# Patient Record
Sex: Female | Born: 1940
Health system: Southern US, Community
[De-identification: ages and names within clinical notes are randomized; demographics above are authoritative.]

## PROBLEM LIST (undated history)

## (undated) DIAGNOSIS — K219 Gastro-esophageal reflux disease without esophagitis: Secondary | ICD-10-CM

## (undated) HISTORY — PX: CHOLECYSTECTOMY: SHX55

## (undated) HISTORY — PX: OTHER SURGICAL HISTORY: SHX169

## (undated) HISTORY — DX: Gastro-esophageal reflux disease without esophagitis: K21.9

---

## 2011-07-19 ENCOUNTER — Ambulatory Visit (INDEPENDENT_AMBULATORY_CARE_PROVIDER_SITE_OTHER): Payer: Medicare HMO | Admitting: Critical Care Medicine

## 2011-07-19 ENCOUNTER — Encounter: Payer: Self-pay | Admitting: Critical Care Medicine

## 2011-07-19 VITALS — BP 140/86 | HR 78 | Temp 97.4°F | Ht 62.5 in | Wt 126.0 lb

## 2011-07-19 DIAGNOSIS — R911 Solitary pulmonary nodule: Secondary | ICD-10-CM

## 2011-07-19 DIAGNOSIS — K219 Gastro-esophageal reflux disease without esophagitis: Secondary | ICD-10-CM

## 2011-07-19 MED ORDER — AZITHROMYCIN 250 MG PO TABS: 250.0000 mg | ORAL_TABLET | Freq: Every day | ORAL | Status: AC

## 2011-07-19 NOTE — Progress Notes (Signed)
Subjective:    Patient ID: Peggy Moore, female    DOB: 12-Oct-1940, 71 y.o.   MRN: [id number]  HPI 71 y.o. WF Referred for lung nodule. Never smoker.  First husband passive smoker 4yrs.  1986 passed away. 2011/03/16:  Awoke in am with chest pain. W/U: in [place].  Neg w/u for cardiac.   ?spot on lung .  PCP saw pt one week ago for a CT scan. Pt rx with URI at this point with cough prod green and is better.  Referral then made with abn CT. Cough now is better. Worse at night.  No chest pain.  Occ chest tightness.  Not dyspneic with exertion. Weight is same.  No new joint pain.  Has varicose veins.    Past Medical History  Diagnosis Date  . GERD (gastroesophageal reflux disease)      Family History  Problem Relation Age of Onset  . Stroke Mother   . Heart attack Father   . Colon cancer Brother      History   Social History  . Marital Status: Widowed    Spouse Name: N/A    Number of Children: 1  . Years of Education: N/A   Occupational History  . Retired     Environmental manager   Social History Main Topics  . Smoking status: Never Smoker   . Smokeless tobacco: Never Used   Comment: Passive smoke exposure x 29 yrs.  . Alcohol Use: No  . Drug Use: No  . Sexually Active: Not on file   Other Topics Concern  . Not on file   Social History Narrative  . No narrative on file     Allergies  Allergen Reactions  . Codeine     hallucinations     No outpatient prescriptions prior to visit.      Review of Systems  Constitutional: Negative for fever, chills, diaphoresis, activity change, appetite change, fatigue and unexpected weight change.  HENT: Positive for congestion and rhinorrhea. Negative for hearing loss, ear pain, nosebleeds, sore throat, facial swelling, sneezing, mouth sores, trouble swallowing, neck pain, neck stiffness, dental problem, voice change, postnasal drip, sinus pressure, tinnitus and ear discharge.   Eyes: Negative for photophobia, discharge,  itching and visual disturbance.  Respiratory: Positive for cough. Negative for apnea, choking, chest tightness, shortness of breath, wheezing and stridor.   Cardiovascular: Positive for chest pain. Negative for palpitations and leg swelling.  Gastrointestinal: Positive for vomiting. Negative for nausea, abdominal pain, constipation, blood in stool and abdominal distention.  Genitourinary: Positive for flank pain. Negative for dysuria, urgency, frequency, hematuria, decreased urine volume and difficulty urinating.  Musculoskeletal: Negative for myalgias, back pain, joint swelling, arthralgias and gait problem.  Skin: Negative for color change, pallor and rash.  Neurological: Negative for dizziness, tremors, seizures, syncope, speech difficulty, weakness, light-headedness, numbness and headaches.  Hematological: Negative for adenopathy. Bruises/bleeds easily.  Psychiatric/Behavioral: Positive for sleep disturbance. Negative for confusion and agitation. The patient is not nervous/anxious.        Objective:   Physical Exam Filed Vitals:   07/19/11 0953  BP: 140/86  Pulse: 78  Temp: 97.4 F (36.3 C)  TempSrc: Oral  Height: 5' 2.5" (1.588 m)  Weight: 126 lb (57.153 kg)  SpO2: 100%    Gen: Pleasant, well-nourished, in no distress,  normal affect  ENT: No lesions,  mouth clear,  oropharynx clear, no postnasal drip  Neck: No JVD, no TMG, no carotid bruits  Lungs: No use of accessory muscles, no  dullness to percussion, clear without rales or rhonchi  Cardiovascular: RRR, heart sounds normal, no murmur or gallops, no peripheral edema  Abdomen: soft and NT, no HSM,  BS normal  Musculoskeletal: No deformities, no cyanosis or clubbing  Neuro: alert, non focal  Skin: Warm, no lesions or rashes  CT Chest 07/12/11:  6 x 7 mm nodule spiculated at end of dilated airway No LAN no other nodules seen         Assessment & Plan:   Lung nodule RLL lung nodule appears to be either old  granuloma or scar. Doubt CA Plan Repeat CT Chest in 6months No indication for biopsy   Acute bronchitis Plan 5 days of azithromycin Updated Medication List Outpatient Encounter Prescriptions as of 07/19/2011  Medication Sig Dispense Refill  . Multiple Vitamin (MULTIVITAMIN) tablet Take 1 tablet by mouth daily.      . pantoprazole (PROTONIX) 40 MG tablet Take 40 mg by mouth daily.       . vitamin B-12 (CYANOCOBALAMIN) 500 MCG tablet Take 500 mcg by mouth daily.      Marland Kitchen azithromycin (ZITHROMAX) 250 MG tablet Take 1 tablet (250 mg total) by mouth daily. Take two once then one daily until gone  6 each  0

## 2011-07-19 NOTE — Patient Instructions (Addendum)
Take azithromycin 250mg  Take two once then one daily until gone Call if cough unimproving Repeat CT Chest in 6months at [facility], I will call with results

## 2011-07-19 NOTE — Assessment & Plan Note (Signed)
RLL lung nodule appears to be either old granuloma or scar. Doubt CA Plan Repeat CT Chest in 6months No indication for biopsy

## 2012-01-22 ENCOUNTER — Telehealth: Payer: Self-pay | Admitting: Critical Care Medicine

## 2012-01-22 DIAGNOSIS — R911 Solitary pulmonary nodule: Secondary | ICD-10-CM

## 2012-01-22 NOTE — Telephone Encounter (Signed)
Pt needs a CT Chest ordered soon.  Hx of lung nodule  I put order into epic

## 2012-01-22 NOTE — Telephone Encounter (Signed)
Chest ct@[facility]  02/15/12@11 :00am pt is aware Tobe Sos

## 2012-01-25 ENCOUNTER — Other Ambulatory Visit: Payer: Medicare HMO

## 2012-01-29 ENCOUNTER — Telehealth: Payer: Self-pay | Admitting: Critical Care Medicine

## 2012-02-04 NOTE — Telephone Encounter (Signed)
error 

## 2012-02-15 ENCOUNTER — Other Ambulatory Visit: Payer: Medicare HMO

## 2012-02-18 ENCOUNTER — Ambulatory Visit (HOSPITAL_COMMUNITY): Payer: Medicare HMO

## 2012-02-19 ENCOUNTER — Ambulatory Visit (HOSPITAL_COMMUNITY): Payer: Medicare HMO

## 2012-02-21 ENCOUNTER — Ambulatory Visit (HOSPITAL_COMMUNITY): Payer: Medicare HMO

## 2012-02-25 ENCOUNTER — Inpatient Hospital Stay (HOSPITAL_COMMUNITY): Admission: RE | Admit: 2012-02-25 | Payer: Medicare HMO | Source: Ambulatory Visit

## 2012-02-25 ENCOUNTER — Ambulatory Visit (HOSPITAL_COMMUNITY): Payer: Medicare HMO

## 2012-02-26 ENCOUNTER — Ambulatory Visit (HOSPITAL_COMMUNITY): Payer: Medicare HMO

## 2012-02-28 ENCOUNTER — Ambulatory Visit (HOSPITAL_COMMUNITY): Payer: Medicare HMO

## 2012-03-03 ENCOUNTER — Ambulatory Visit (HOSPITAL_COMMUNITY): Payer: Medicare HMO

## 2012-03-04 ENCOUNTER — Ambulatory Visit (HOSPITAL_COMMUNITY): Payer: Medicare HMO

## 2012-03-06 ENCOUNTER — Ambulatory Visit (HOSPITAL_COMMUNITY): Payer: Medicare HMO

## 2012-03-10 ENCOUNTER — Ambulatory Visit (HOSPITAL_COMMUNITY): Payer: Medicare HMO

## 2012-03-11 ENCOUNTER — Ambulatory Visit (HOSPITAL_COMMUNITY): Payer: Medicare HMO

## 2012-03-13 ENCOUNTER — Ambulatory Visit (HOSPITAL_COMMUNITY): Payer: Medicare HMO

## 2012-03-17 ENCOUNTER — Ambulatory Visit (HOSPITAL_COMMUNITY): Payer: Medicare HMO

## 2012-03-18 ENCOUNTER — Ambulatory Visit (HOSPITAL_COMMUNITY): Payer: Medicare HMO

## 2012-03-20 ENCOUNTER — Ambulatory Visit (HOSPITAL_COMMUNITY): Payer: Medicare HMO

## 2012-03-24 ENCOUNTER — Ambulatory Visit (HOSPITAL_COMMUNITY): Payer: Medicare HMO

## 2012-03-25 ENCOUNTER — Ambulatory Visit (HOSPITAL_COMMUNITY): Payer: Medicare HMO

## 2012-03-27 ENCOUNTER — Ambulatory Visit (HOSPITAL_COMMUNITY): Payer: Medicare HMO

## 2012-03-31 ENCOUNTER — Ambulatory Visit (HOSPITAL_COMMUNITY): Payer: Medicare HMO

## 2012-04-01 ENCOUNTER — Ambulatory Visit (HOSPITAL_COMMUNITY): Payer: Medicare HMO

## 2012-04-02 ENCOUNTER — Telehealth: Payer: Self-pay | Admitting: Critical Care Medicine

## 2012-04-02 DIAGNOSIS — E854 Organ-limited amyloidosis: Secondary | ICD-10-CM

## 2012-04-02 DIAGNOSIS — R911 Solitary pulmonary nodule: Secondary | ICD-10-CM

## 2012-04-02 NOTE — Telephone Encounter (Signed)
CT Chest order sent to [facility] with instructions to set up ov with PW for f/u after ct done [facility] for pt

## 2012-04-02 NOTE — Telephone Encounter (Signed)
Pt last seen 07-19-11 for lung nodule and was advised to have repeat CT scan in 6 months to f/u on this. Pt cancelled CT appt in September and is now calling  to have this rescheduled. Please advise if ok to re-order the CT and does the pt need a f/u here after CT scan? Please advise. Carron Curie, CMA

## 2012-04-02 NOTE — Telephone Encounter (Signed)
Re order CT Chest non contrast .  Needs OV after CT done

## 2012-04-03 ENCOUNTER — Ambulatory Visit (HOSPITAL_COMMUNITY): Payer: Medicare HMO

## 2012-04-03 NOTE — Telephone Encounter (Signed)
LMOMTCB x 1 for the pt.

## 2012-04-04 NOTE — Telephone Encounter (Signed)
Patient returned call. Patient has been scheduled for CT scan Mon 04/07/12 at 1030am.  Follow Up Appt w Dr. Delford Field has also been scheduled for 04/09/12 at 1045am.  Verified with patient and nothing further needed at this time.

## 2012-04-07 ENCOUNTER — Ambulatory Visit (INDEPENDENT_AMBULATORY_CARE_PROVIDER_SITE_OTHER)
Admission: RE | Admit: 2012-04-07 | Discharge: 2012-04-07 | Disposition: A | Discharge status: Home / Self Care | Payer: Medicare HMO | Source: Ambulatory Visit | Attending: Critical Care Medicine | Admitting: Critical Care Medicine

## 2012-04-07 ENCOUNTER — Ambulatory Visit (HOSPITAL_COMMUNITY): Payer: Medicare HMO

## 2012-04-07 DIAGNOSIS — R911 Solitary pulmonary nodule: Secondary | ICD-10-CM

## 2012-04-08 ENCOUNTER — Ambulatory Visit (HOSPITAL_COMMUNITY): Payer: Medicare HMO

## 2012-04-08 ENCOUNTER — Telehealth: Payer: Self-pay | Admitting: Critical Care Medicine

## 2012-04-08 DIAGNOSIS — R911 Solitary pulmonary nodule: Secondary | ICD-10-CM

## 2012-04-08 NOTE — Telephone Encounter (Signed)
No change in nodule in lung. Repeat CT spring 2015

## 2012-04-09 ENCOUNTER — Ambulatory Visit: Payer: Medicare HMO | Admitting: Critical Care Medicine

## 2012-04-10 ENCOUNTER — Ambulatory Visit (HOSPITAL_COMMUNITY): Payer: Medicare HMO

## 2012-04-14 ENCOUNTER — Ambulatory Visit (HOSPITAL_COMMUNITY): Payer: Medicare HMO

## 2012-04-15 ENCOUNTER — Ambulatory Visit (HOSPITAL_COMMUNITY): Payer: Medicare HMO

## 2012-04-17 ENCOUNTER — Ambulatory Visit (HOSPITAL_COMMUNITY): Payer: Medicare HMO

## 2012-04-21 ENCOUNTER — Ambulatory Visit (HOSPITAL_COMMUNITY): Payer: Medicare HMO

## 2012-04-22 ENCOUNTER — Ambulatory Visit (HOSPITAL_COMMUNITY): Payer: Medicare HMO

## 2012-04-24 ENCOUNTER — Ambulatory Visit (HOSPITAL_COMMUNITY): Payer: Medicare HMO

## 2012-04-28 ENCOUNTER — Ambulatory Visit (HOSPITAL_COMMUNITY): Payer: Medicare HMO

## 2012-07-21 ENCOUNTER — Telehealth: Payer: Self-pay | Admitting: Pulmonary Disease

## 2012-07-21 ENCOUNTER — Encounter: Payer: Self-pay | Admitting: Pulmonary Disease

## 2012-07-21 NOTE — Telephone Encounter (Signed)
Peggy Bible  Was called evening of 3/24 by this patient via answering service. Call back number given as [phone]. My return call was not answered. Not sure what the reason for the call was  Billy Fischer, MD ; [facility] [phone].  After 5:30 PM or weekends, call [phone]

## 2012-07-21 NOTE — Telephone Encounter (Signed)
pls call pt in am and find out what she needs

## 2012-07-22 NOTE — Telephone Encounter (Signed)
Noted and thanks.

## 2012-07-22 NOTE — Telephone Encounter (Signed)
Called spoke with patient who stated that she did not call yesterday - she has been out of the country on vacation and reports no issues.  Called the phone number documented below > line rang multiple times with no answer and no option to leave message.  Unable to search for patient by phone number in epic.    There is another message in triage for a patient w/ similarly-spelled name and the phone number is the same with juxtaposed numbers.  Will sign and forward to PW as FYI.

## 2013-09-10 ENCOUNTER — Telehealth: Payer: Self-pay | Admitting: *Deleted

## 2013-09-10 DIAGNOSIS — R911 Solitary pulmonary nodule: Secondary | ICD-10-CM

## 2013-09-10 NOTE — Telephone Encounter (Signed)
Message copied by Valentino HueJONES, Chianne Byrns D on Thu Sep 10, 2013  1:57 PM ------      Message from: Shan LevansWRIGHT, PATRICK E      Created: Thu Sep 10, 2013 11:32 AM       Needs repeat ct scan to f/u lung nodule  No contrast      ----- Message -----         From: Storm FriskPatrick E Wright, MD         Sent: 04/08/2012   3:29 PM           To: Storm FriskPatrick E Wright, MD            Repeat ct chest        ------

## 2013-09-10 NOTE — Telephone Encounter (Signed)
CT order placed. lmomtcb for pt to inform her PCCs will be calling to schedule this.

## 2013-09-14 NOTE — Telephone Encounter (Signed)
Called, spoke with pt.  She has already been called -- CT is scheduled for May 21. Pt aware and voiced no further questions or concerns at this time.

## 2013-09-17 ENCOUNTER — Ambulatory Visit (INDEPENDENT_AMBULATORY_CARE_PROVIDER_SITE_OTHER)
Admission: RE | Admit: 2013-09-17 | Discharge: 2013-09-17 | Disposition: A | Discharge status: Home / Self Care | Payer: Commercial Managed Care - HMO | Source: Ambulatory Visit | Attending: Critical Care Medicine | Admitting: Critical Care Medicine

## 2013-09-17 DIAGNOSIS — R911 Solitary pulmonary nodule: Secondary | ICD-10-CM

## 2013-09-24 ENCOUNTER — Telehealth: Payer: Self-pay | Admitting: Critical Care Medicine

## 2013-09-24 DIAGNOSIS — R911 Solitary pulmonary nodule: Secondary | ICD-10-CM

## 2013-09-24 NOTE — Telephone Encounter (Signed)
lmtcb x1 

## 2013-09-24 NOTE — Telephone Encounter (Signed)
CT Scan of chest shows NO change in nodule and is BENIGN.  No further CT scans needed

## 2013-09-28 NOTE — Telephone Encounter (Signed)
lmomtcb  

## 2013-09-29 NOTE — Telephone Encounter (Signed)
lmomtcb for pt 

## 2013-09-29 NOTE — Telephone Encounter (Signed)
lmomtcb for pt on # provided below by pt

## 2013-09-29 NOTE — Telephone Encounter (Signed)
Pt returned call & asks to be reached at [phone].  Peggy Moore

## 2013-09-30 NOTE — Telephone Encounter (Signed)
Called, spoke with pt.  Informed her of CT Chest results and recs per Dr.  Wright.  She verbalized understanding and voiced no further questions or concerns at this time. 

## 2013-12-20 IMAGING — CT CT CHEST W/O CM
2 of 3 series · 15 of 36 positions shown, 18 images · IV contrast (Omnipaque 300)
Comparison: 07/12/2011

CLINICAL DATA: Follow up pulmonary nodule

CT CHEST WITHOUT CONTRAST
TECHNIQUE: Multidetector CT imaging of the chest was performed
following the standard protocol without IV contrast.

[Series 2: chest routine with · axial · 0.62mm/px · z∈[-286,-40]mm · 12 of 59 slices shown, 15 images]
[im 5/59  mediastinal]
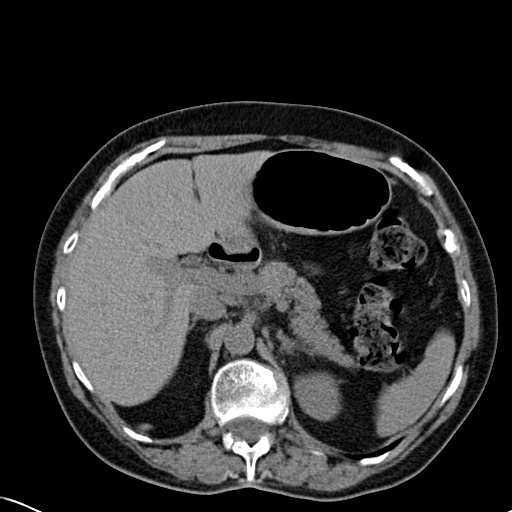
[im 5/59  lung]
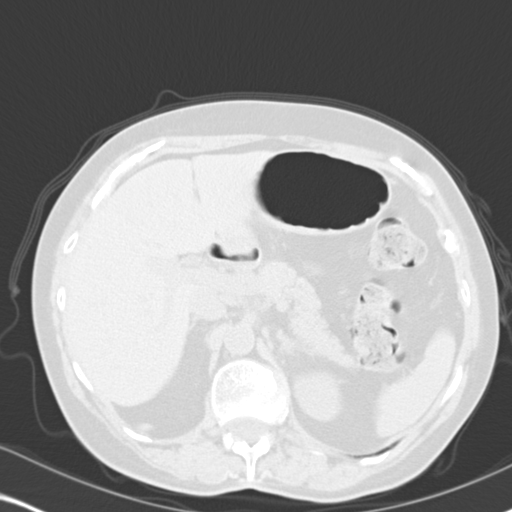
[im 9/59  lung]
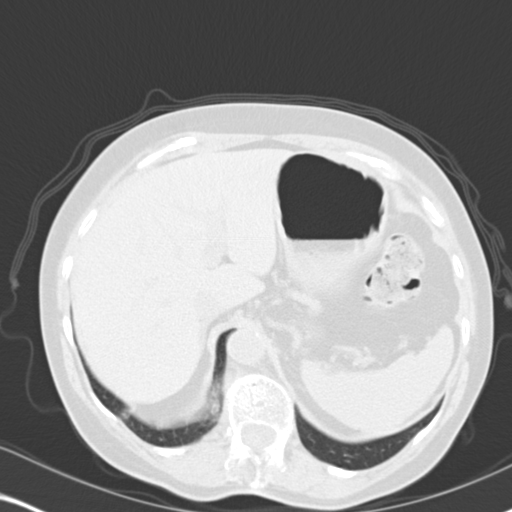
[im 13/59  lung]
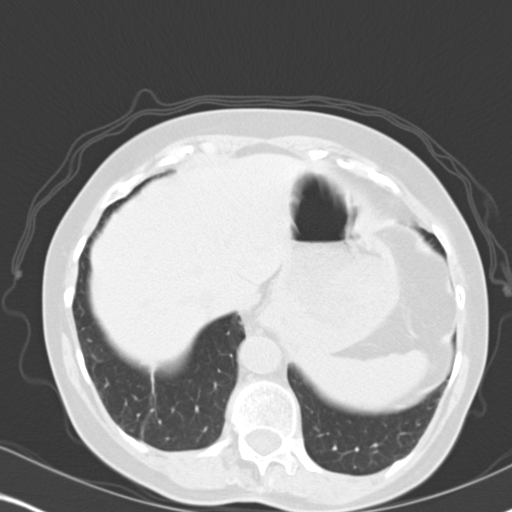
[im 18/59  lung]
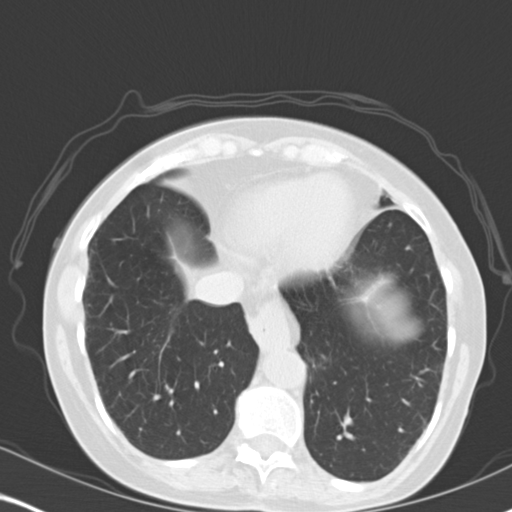
[im 22/59  mediastinal]
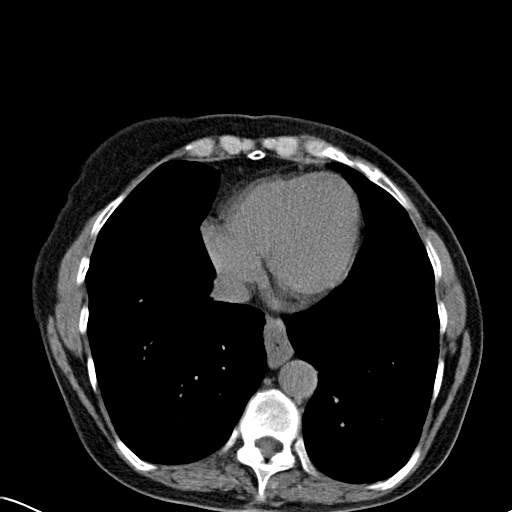
[im 22/59  lung]
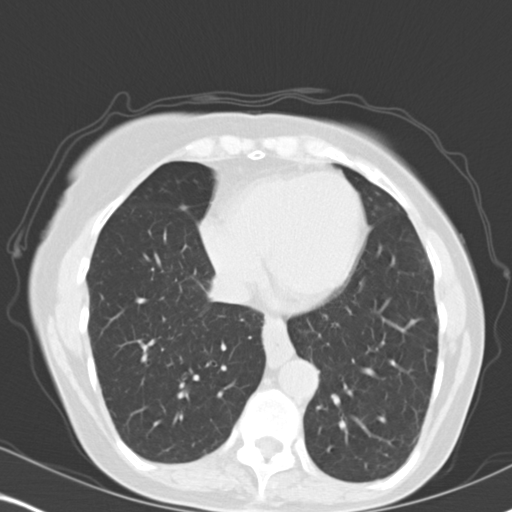
[im 26/59  lung]
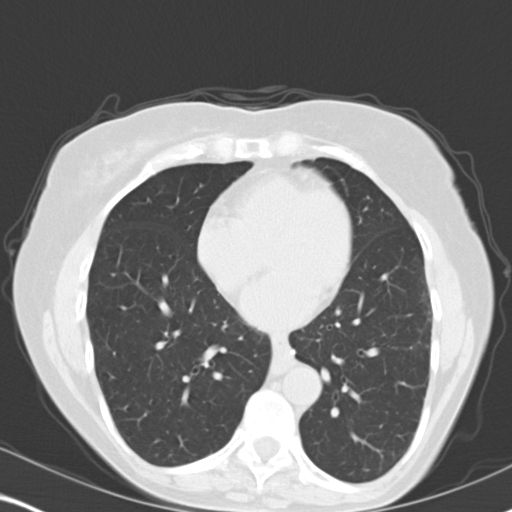
[im 33/59  lung]
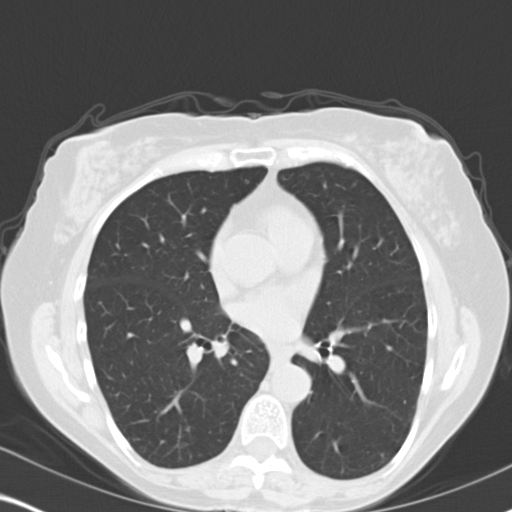
[im 37/59  lung]
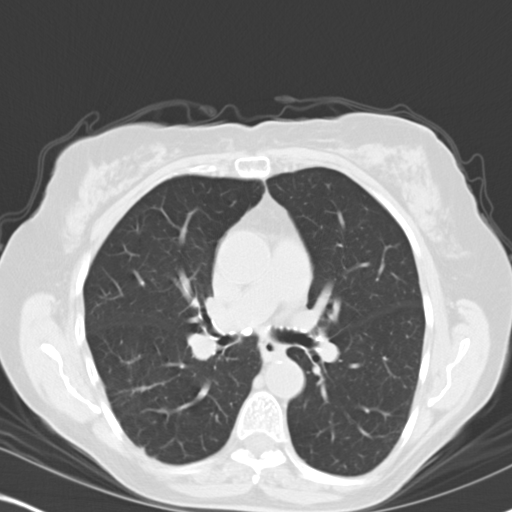
[im 41/59  mediastinal]
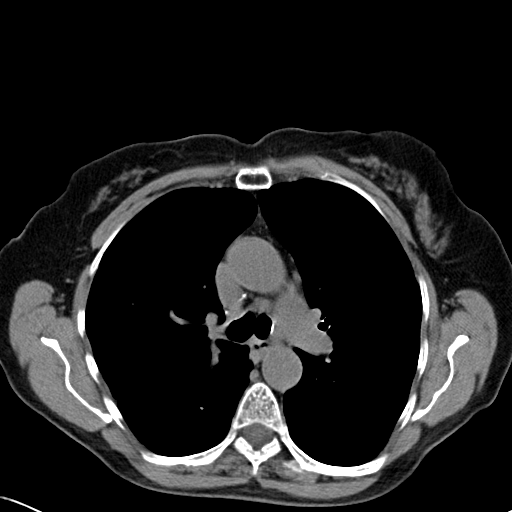
[im 41/59  lung]
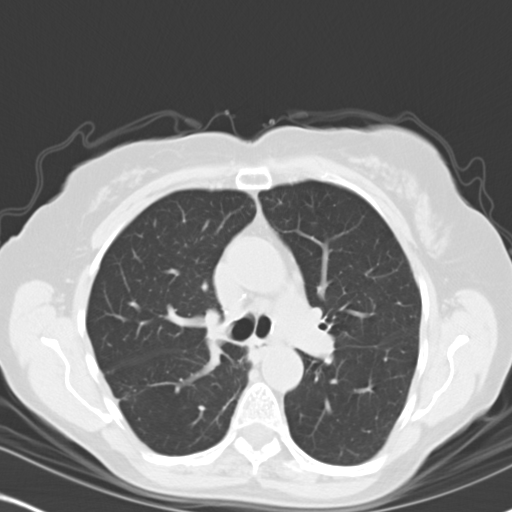
[im 46/59  lung]
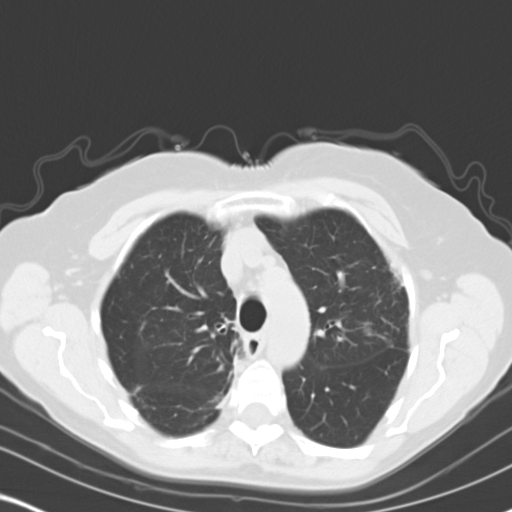
[im 50/59  lung]
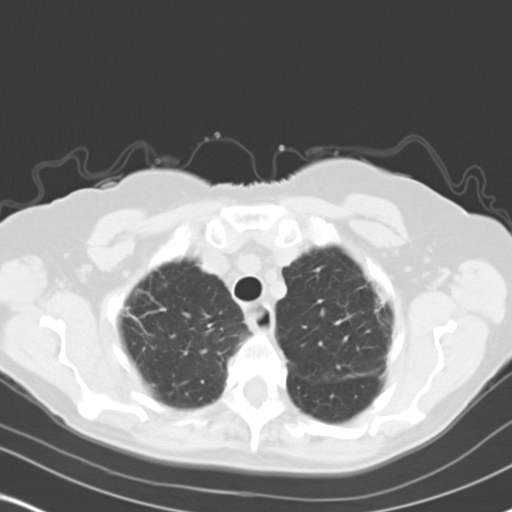
[im 54/59  lung]
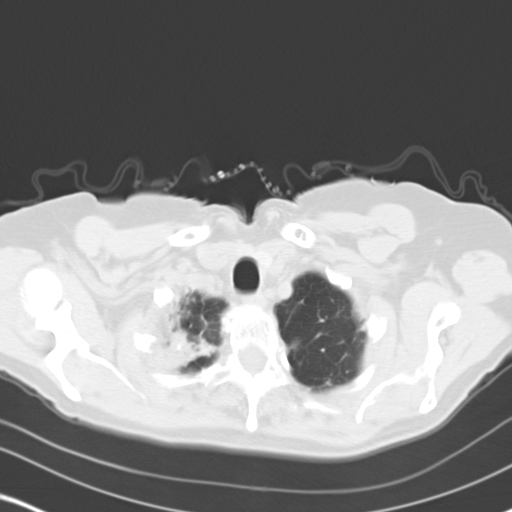

[Series 602: cor · coronal · 0.62mm/px · 3 of 111 slices shown]
[im 23/111  lung]
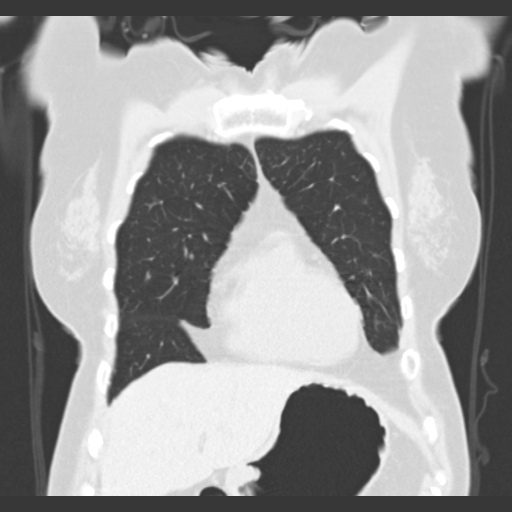
[im 45/111  lung]
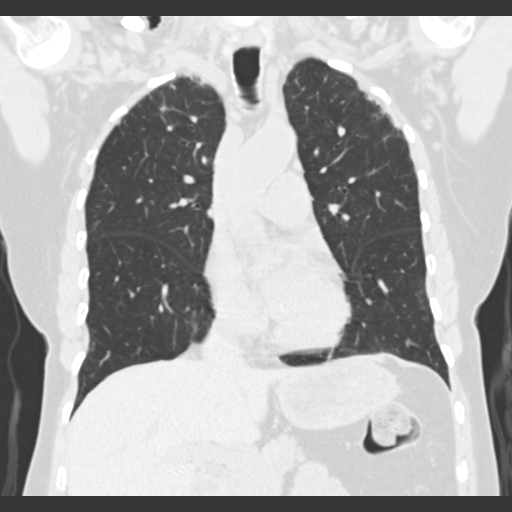
[im 67/111  lung]
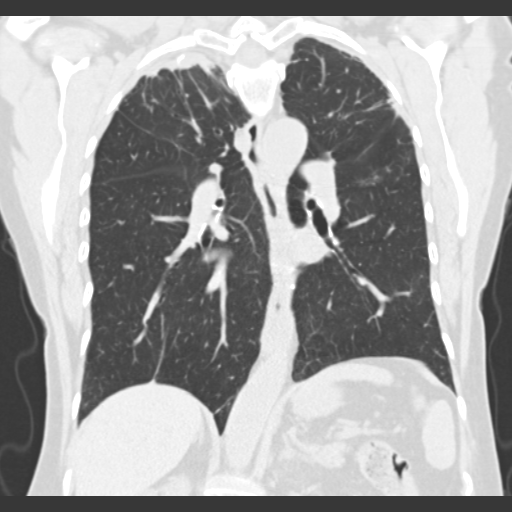

[15 of 36 positions shown; findings below may reference images not displayed]

FINDINGS: Lungs/pleura: There is no pleural effusion.
Bronchiectasis within the superior segment of the right lower lobe
is identified.  There is a pulmonary nodule within the superior
segment of the right lower lobe which measures 6 mm, image 21.
This is unchanged from previous exam.  Upper lobe predominant
peripheral and apical scarring is identified and appears similar to
previous exam.

Heart/Mediastinum: Normal heart size.  Calcified mediastinal lymph
nodes are noted.  No adenopathy.  No pericardial effusion.

Upper abdomen: Previous cholecystectomy. Small hiatal hernia noted.
There is circumferential wall thickening involving the distal
esophagus.

Bones/Musculoskeletal:  The bones appear osteopenic.
IMPRESSION: 1.  Stable spiculated nodule within the superior segment of the
right lower lobe. The next follow-up exam should be obtained in 18-
24 months.

2.  Focal area of bronchiectasis within the superior segment of the
right lower lobe as before.
3.  Small hiatal hernia with distal esophageal wall thickening.

4.  Prior cholecystectomy.

## 2014-05-03 DIAGNOSIS — E780 Pure hypercholesterolemia: Secondary | ICD-10-CM | POA: Diagnosis not present

## 2014-05-03 DIAGNOSIS — G4700 Insomnia, unspecified: Secondary | ICD-10-CM | POA: Diagnosis not present

## 2014-05-03 DIAGNOSIS — Z9181 History of falling: Secondary | ICD-10-CM | POA: Diagnosis not present

## 2014-05-03 DIAGNOSIS — R911 Solitary pulmonary nodule: Secondary | ICD-10-CM | POA: Diagnosis not present

## 2014-05-03 DIAGNOSIS — Z1389 Encounter for screening for other disorder: Secondary | ICD-10-CM | POA: Diagnosis not present

## 2014-05-03 DIAGNOSIS — K219 Gastro-esophageal reflux disease without esophagitis: Secondary | ICD-10-CM | POA: Diagnosis not present

## 2014-05-03 DIAGNOSIS — J309 Allergic rhinitis, unspecified: Secondary | ICD-10-CM | POA: Diagnosis not present

## 2014-05-03 DIAGNOSIS — E46 Unspecified protein-calorie malnutrition: Secondary | ICD-10-CM | POA: Diagnosis not present

## 2014-06-25 DIAGNOSIS — G4700 Insomnia, unspecified: Secondary | ICD-10-CM | POA: Diagnosis not present

## 2014-06-25 DIAGNOSIS — R911 Solitary pulmonary nodule: Secondary | ICD-10-CM | POA: Diagnosis not present

## 2014-06-25 DIAGNOSIS — J309 Allergic rhinitis, unspecified: Secondary | ICD-10-CM | POA: Diagnosis not present

## 2014-06-25 DIAGNOSIS — E780 Pure hypercholesterolemia: Secondary | ICD-10-CM | POA: Diagnosis not present

## 2014-06-25 DIAGNOSIS — E46 Unspecified protein-calorie malnutrition: Secondary | ICD-10-CM | POA: Diagnosis not present

## 2014-06-25 DIAGNOSIS — K219 Gastro-esophageal reflux disease without esophagitis: Secondary | ICD-10-CM | POA: Diagnosis not present

## 2014-06-25 DIAGNOSIS — B029 Zoster without complications: Secondary | ICD-10-CM | POA: Diagnosis not present

## 2014-06-25 DIAGNOSIS — M549 Dorsalgia, unspecified: Secondary | ICD-10-CM | POA: Diagnosis not present

## 2014-07-01 DIAGNOSIS — B029 Zoster without complications: Secondary | ICD-10-CM | POA: Diagnosis not present

## 2014-07-01 DIAGNOSIS — E780 Pure hypercholesterolemia: Secondary | ICD-10-CM | POA: Diagnosis not present

## 2014-07-01 DIAGNOSIS — M549 Dorsalgia, unspecified: Secondary | ICD-10-CM | POA: Diagnosis not present

## 2014-07-01 DIAGNOSIS — K219 Gastro-esophageal reflux disease without esophagitis: Secondary | ICD-10-CM | POA: Diagnosis not present

## 2014-07-01 DIAGNOSIS — J309 Allergic rhinitis, unspecified: Secondary | ICD-10-CM | POA: Diagnosis not present

## 2014-07-01 DIAGNOSIS — G4700 Insomnia, unspecified: Secondary | ICD-10-CM | POA: Diagnosis not present

## 2014-07-01 DIAGNOSIS — E46 Unspecified protein-calorie malnutrition: Secondary | ICD-10-CM | POA: Diagnosis not present

## 2014-07-01 DIAGNOSIS — R911 Solitary pulmonary nodule: Secondary | ICD-10-CM | POA: Diagnosis not present

## 2014-08-02 DIAGNOSIS — E46 Unspecified protein-calorie malnutrition: Secondary | ICD-10-CM | POA: Diagnosis not present

## 2014-08-02 DIAGNOSIS — E780 Pure hypercholesterolemia: Secondary | ICD-10-CM | POA: Diagnosis not present

## 2014-08-02 DIAGNOSIS — R911 Solitary pulmonary nodule: Secondary | ICD-10-CM | POA: Diagnosis not present

## 2014-08-02 DIAGNOSIS — J309 Allergic rhinitis, unspecified: Secondary | ICD-10-CM | POA: Diagnosis not present

## 2014-08-02 DIAGNOSIS — G4700 Insomnia, unspecified: Secondary | ICD-10-CM | POA: Diagnosis not present

## 2014-08-02 DIAGNOSIS — K219 Gastro-esophageal reflux disease without esophagitis: Secondary | ICD-10-CM | POA: Diagnosis not present

## 2014-08-02 DIAGNOSIS — M549 Dorsalgia, unspecified: Secondary | ICD-10-CM | POA: Diagnosis not present

## 2014-08-02 DIAGNOSIS — Z681 Body mass index (BMI) 19 or less, adult: Secondary | ICD-10-CM | POA: Diagnosis not present

## 2014-09-10 DIAGNOSIS — M549 Dorsalgia, unspecified: Secondary | ICD-10-CM | POA: Diagnosis not present

## 2014-09-10 DIAGNOSIS — R911 Solitary pulmonary nodule: Secondary | ICD-10-CM | POA: Diagnosis not present

## 2014-09-10 DIAGNOSIS — G4700 Insomnia, unspecified: Secondary | ICD-10-CM | POA: Diagnosis not present

## 2014-09-10 DIAGNOSIS — L309 Dermatitis, unspecified: Secondary | ICD-10-CM | POA: Diagnosis not present

## 2014-09-10 DIAGNOSIS — E46 Unspecified protein-calorie malnutrition: Secondary | ICD-10-CM | POA: Diagnosis not present

## 2014-09-10 DIAGNOSIS — J309 Allergic rhinitis, unspecified: Secondary | ICD-10-CM | POA: Diagnosis not present

## 2014-09-10 DIAGNOSIS — E780 Pure hypercholesterolemia: Secondary | ICD-10-CM | POA: Diagnosis not present

## 2014-09-10 DIAGNOSIS — K219 Gastro-esophageal reflux disease without esophagitis: Secondary | ICD-10-CM | POA: Diagnosis not present

## 2014-09-29 DIAGNOSIS — N390 Urinary tract infection, site not specified: Secondary | ICD-10-CM | POA: Diagnosis not present

## 2014-09-29 DIAGNOSIS — K219 Gastro-esophageal reflux disease without esophagitis: Secondary | ICD-10-CM | POA: Diagnosis not present

## 2014-09-29 DIAGNOSIS — M549 Dorsalgia, unspecified: Secondary | ICD-10-CM | POA: Diagnosis not present

## 2014-09-29 DIAGNOSIS — G4700 Insomnia, unspecified: Secondary | ICD-10-CM | POA: Diagnosis not present

## 2014-09-29 DIAGNOSIS — E46 Unspecified protein-calorie malnutrition: Secondary | ICD-10-CM | POA: Diagnosis not present

## 2014-09-29 DIAGNOSIS — R911 Solitary pulmonary nodule: Secondary | ICD-10-CM | POA: Diagnosis not present

## 2014-09-29 DIAGNOSIS — J309 Allergic rhinitis, unspecified: Secondary | ICD-10-CM | POA: Diagnosis not present

## 2014-09-29 DIAGNOSIS — E780 Pure hypercholesterolemia: Secondary | ICD-10-CM | POA: Diagnosis not present

## 2014-11-03 DIAGNOSIS — M549 Dorsalgia, unspecified: Secondary | ICD-10-CM | POA: Diagnosis not present

## 2014-11-03 DIAGNOSIS — E780 Pure hypercholesterolemia: Secondary | ICD-10-CM | POA: Diagnosis not present

## 2014-11-03 DIAGNOSIS — K219 Gastro-esophageal reflux disease without esophagitis: Secondary | ICD-10-CM | POA: Diagnosis not present

## 2014-11-03 DIAGNOSIS — N390 Urinary tract infection, site not specified: Secondary | ICD-10-CM | POA: Diagnosis not present

## 2014-11-03 DIAGNOSIS — R911 Solitary pulmonary nodule: Secondary | ICD-10-CM | POA: Diagnosis not present

## 2014-11-03 DIAGNOSIS — E46 Unspecified protein-calorie malnutrition: Secondary | ICD-10-CM | POA: Diagnosis not present

## 2014-11-03 DIAGNOSIS — G4700 Insomnia, unspecified: Secondary | ICD-10-CM | POA: Diagnosis not present

## 2014-11-03 DIAGNOSIS — J309 Allergic rhinitis, unspecified: Secondary | ICD-10-CM | POA: Diagnosis not present

## 2014-11-04 DIAGNOSIS — K219 Gastro-esophageal reflux disease without esophagitis: Secondary | ICD-10-CM | POA: Diagnosis not present

## 2014-11-04 DIAGNOSIS — M549 Dorsalgia, unspecified: Secondary | ICD-10-CM | POA: Diagnosis not present

## 2014-11-04 DIAGNOSIS — E46 Unspecified protein-calorie malnutrition: Secondary | ICD-10-CM | POA: Diagnosis not present

## 2014-11-04 DIAGNOSIS — E780 Pure hypercholesterolemia: Secondary | ICD-10-CM | POA: Diagnosis not present

## 2014-11-04 DIAGNOSIS — L309 Dermatitis, unspecified: Secondary | ICD-10-CM | POA: Diagnosis not present

## 2014-11-04 DIAGNOSIS — R911 Solitary pulmonary nodule: Secondary | ICD-10-CM | POA: Diagnosis not present

## 2014-11-04 DIAGNOSIS — J309 Allergic rhinitis, unspecified: Secondary | ICD-10-CM | POA: Diagnosis not present

## 2014-11-04 DIAGNOSIS — G4700 Insomnia, unspecified: Secondary | ICD-10-CM | POA: Diagnosis not present

## 2014-11-16 DIAGNOSIS — J309 Allergic rhinitis, unspecified: Secondary | ICD-10-CM | POA: Diagnosis not present

## 2014-11-16 DIAGNOSIS — E46 Unspecified protein-calorie malnutrition: Secondary | ICD-10-CM | POA: Diagnosis not present

## 2014-11-16 DIAGNOSIS — E780 Pure hypercholesterolemia: Secondary | ICD-10-CM | POA: Diagnosis not present

## 2014-11-16 DIAGNOSIS — R911 Solitary pulmonary nodule: Secondary | ICD-10-CM | POA: Diagnosis not present

## 2014-11-16 DIAGNOSIS — K219 Gastro-esophageal reflux disease without esophagitis: Secondary | ICD-10-CM | POA: Diagnosis not present

## 2014-11-16 DIAGNOSIS — M549 Dorsalgia, unspecified: Secondary | ICD-10-CM | POA: Diagnosis not present

## 2014-11-16 DIAGNOSIS — G4700 Insomnia, unspecified: Secondary | ICD-10-CM | POA: Diagnosis not present

## 2014-11-16 DIAGNOSIS — K120 Recurrent oral aphthae: Secondary | ICD-10-CM | POA: Diagnosis not present

## 2015-01-26 DIAGNOSIS — Z23 Encounter for immunization: Secondary | ICD-10-CM | POA: Diagnosis not present

## 2015-01-26 DIAGNOSIS — K219 Gastro-esophageal reflux disease without esophagitis: Secondary | ICD-10-CM | POA: Diagnosis not present

## 2015-01-26 DIAGNOSIS — E780 Pure hypercholesterolemia: Secondary | ICD-10-CM | POA: Diagnosis not present

## 2015-01-26 DIAGNOSIS — J309 Allergic rhinitis, unspecified: Secondary | ICD-10-CM | POA: Diagnosis not present

## 2015-01-26 DIAGNOSIS — R911 Solitary pulmonary nodule: Secondary | ICD-10-CM | POA: Diagnosis not present

## 2015-01-26 DIAGNOSIS — G4700 Insomnia, unspecified: Secondary | ICD-10-CM | POA: Diagnosis not present

## 2015-01-26 DIAGNOSIS — N3281 Overactive bladder: Secondary | ICD-10-CM | POA: Diagnosis not present

## 2015-01-26 DIAGNOSIS — M549 Dorsalgia, unspecified: Secondary | ICD-10-CM | POA: Diagnosis not present

## 2015-02-03 DIAGNOSIS — E7800 Pure hypercholesterolemia, unspecified: Secondary | ICD-10-CM | POA: Diagnosis not present

## 2015-02-03 DIAGNOSIS — G4700 Insomnia, unspecified: Secondary | ICD-10-CM | POA: Diagnosis not present

## 2015-02-03 DIAGNOSIS — K219 Gastro-esophageal reflux disease without esophagitis: Secondary | ICD-10-CM | POA: Diagnosis not present

## 2015-02-03 DIAGNOSIS — E46 Unspecified protein-calorie malnutrition: Secondary | ICD-10-CM | POA: Diagnosis not present

## 2015-02-03 DIAGNOSIS — M549 Dorsalgia, unspecified: Secondary | ICD-10-CM | POA: Diagnosis not present

## 2015-02-03 DIAGNOSIS — R911 Solitary pulmonary nodule: Secondary | ICD-10-CM | POA: Diagnosis not present

## 2015-02-03 DIAGNOSIS — J309 Allergic rhinitis, unspecified: Secondary | ICD-10-CM | POA: Diagnosis not present

## 2015-02-03 DIAGNOSIS — Z681 Body mass index (BMI) 19 or less, adult: Secondary | ICD-10-CM | POA: Diagnosis not present

## 2015-05-13 DIAGNOSIS — K219 Gastro-esophageal reflux disease without esophagitis: Secondary | ICD-10-CM | POA: Diagnosis not present

## 2015-05-13 DIAGNOSIS — G4700 Insomnia, unspecified: Secondary | ICD-10-CM | POA: Diagnosis not present

## 2015-05-13 DIAGNOSIS — J309 Allergic rhinitis, unspecified: Secondary | ICD-10-CM | POA: Diagnosis not present

## 2015-05-13 DIAGNOSIS — E7800 Pure hypercholesterolemia, unspecified: Secondary | ICD-10-CM | POA: Diagnosis not present

## 2015-05-13 DIAGNOSIS — Z1389 Encounter for screening for other disorder: Secondary | ICD-10-CM | POA: Diagnosis not present

## 2015-05-13 DIAGNOSIS — R911 Solitary pulmonary nodule: Secondary | ICD-10-CM | POA: Diagnosis not present

## 2015-05-13 DIAGNOSIS — E46 Unspecified protein-calorie malnutrition: Secondary | ICD-10-CM | POA: Diagnosis not present

## 2015-05-13 DIAGNOSIS — M549 Dorsalgia, unspecified: Secondary | ICD-10-CM | POA: Diagnosis not present

## 2015-05-13 DIAGNOSIS — J209 Acute bronchitis, unspecified: Secondary | ICD-10-CM | POA: Diagnosis not present

## 2015-06-28 DIAGNOSIS — H3343 Traction detachment of retina, bilateral: Secondary | ICD-10-CM | POA: Diagnosis not present

## 2015-06-28 DIAGNOSIS — Z961 Presence of intraocular lens: Secondary | ICD-10-CM | POA: Diagnosis not present

## 2015-07-11 DIAGNOSIS — R911 Solitary pulmonary nodule: Secondary | ICD-10-CM | POA: Diagnosis not present

## 2015-07-11 DIAGNOSIS — J309 Allergic rhinitis, unspecified: Secondary | ICD-10-CM | POA: Diagnosis not present

## 2015-07-11 DIAGNOSIS — E7800 Pure hypercholesterolemia, unspecified: Secondary | ICD-10-CM | POA: Diagnosis not present

## 2015-07-11 DIAGNOSIS — G4700 Insomnia, unspecified: Secondary | ICD-10-CM | POA: Diagnosis not present

## 2015-07-11 DIAGNOSIS — K219 Gastro-esophageal reflux disease without esophagitis: Secondary | ICD-10-CM | POA: Diagnosis not present

## 2015-07-11 DIAGNOSIS — E46 Unspecified protein-calorie malnutrition: Secondary | ICD-10-CM | POA: Diagnosis not present

## 2015-07-11 DIAGNOSIS — M549 Dorsalgia, unspecified: Secondary | ICD-10-CM | POA: Diagnosis not present

## 2015-07-11 DIAGNOSIS — H538 Other visual disturbances: Secondary | ICD-10-CM | POA: Diagnosis not present

## 2015-07-28 DIAGNOSIS — H35373 Puckering of macula, bilateral: Secondary | ICD-10-CM | POA: Diagnosis not present

## 2015-08-09 DIAGNOSIS — H35372 Puckering of macula, left eye: Secondary | ICD-10-CM | POA: Diagnosis not present

## 2015-08-09 DIAGNOSIS — H43812 Vitreous degeneration, left eye: Secondary | ICD-10-CM | POA: Diagnosis not present

## 2015-08-09 DIAGNOSIS — Z961 Presence of intraocular lens: Secondary | ICD-10-CM | POA: Diagnosis not present

## 2015-08-17 DIAGNOSIS — J309 Allergic rhinitis, unspecified: Secondary | ICD-10-CM | POA: Diagnosis not present

## 2015-08-17 DIAGNOSIS — E7800 Pure hypercholesterolemia, unspecified: Secondary | ICD-10-CM | POA: Diagnosis not present

## 2015-08-17 DIAGNOSIS — Z681 Body mass index (BMI) 19 or less, adult: Secondary | ICD-10-CM | POA: Diagnosis not present

## 2015-08-17 DIAGNOSIS — K219 Gastro-esophageal reflux disease without esophagitis: Secondary | ICD-10-CM | POA: Diagnosis not present

## 2015-08-17 DIAGNOSIS — M549 Dorsalgia, unspecified: Secondary | ICD-10-CM | POA: Diagnosis not present

## 2015-08-17 DIAGNOSIS — R911 Solitary pulmonary nodule: Secondary | ICD-10-CM | POA: Diagnosis not present

## 2015-08-17 DIAGNOSIS — E46 Unspecified protein-calorie malnutrition: Secondary | ICD-10-CM | POA: Diagnosis not present

## 2015-08-17 DIAGNOSIS — G4700 Insomnia, unspecified: Secondary | ICD-10-CM | POA: Diagnosis not present

## 2015-09-08 DIAGNOSIS — H35372 Puckering of macula, left eye: Secondary | ICD-10-CM | POA: Diagnosis not present

## 2015-09-15 DIAGNOSIS — E7800 Pure hypercholesterolemia, unspecified: Secondary | ICD-10-CM | POA: Diagnosis not present

## 2015-09-15 DIAGNOSIS — E46 Unspecified protein-calorie malnutrition: Secondary | ICD-10-CM | POA: Diagnosis not present

## 2015-09-15 DIAGNOSIS — G4700 Insomnia, unspecified: Secondary | ICD-10-CM | POA: Diagnosis not present

## 2015-09-15 DIAGNOSIS — Z681 Body mass index (BMI) 19 or less, adult: Secondary | ICD-10-CM | POA: Diagnosis not present

## 2015-09-15 DIAGNOSIS — M549 Dorsalgia, unspecified: Secondary | ICD-10-CM | POA: Diagnosis not present

## 2015-09-15 DIAGNOSIS — K219 Gastro-esophageal reflux disease without esophagitis: Secondary | ICD-10-CM | POA: Diagnosis not present

## 2015-09-15 DIAGNOSIS — J309 Allergic rhinitis, unspecified: Secondary | ICD-10-CM | POA: Diagnosis not present

## 2015-09-15 DIAGNOSIS — R911 Solitary pulmonary nodule: Secondary | ICD-10-CM | POA: Diagnosis not present

## 2015-11-24 DIAGNOSIS — H35371 Puckering of macula, right eye: Secondary | ICD-10-CM | POA: Diagnosis not present

## 2015-11-25 DIAGNOSIS — R911 Solitary pulmonary nodule: Secondary | ICD-10-CM | POA: Diagnosis not present

## 2015-11-25 DIAGNOSIS — J309 Allergic rhinitis, unspecified: Secondary | ICD-10-CM | POA: Diagnosis not present

## 2015-11-25 DIAGNOSIS — G4700 Insomnia, unspecified: Secondary | ICD-10-CM | POA: Diagnosis not present

## 2015-11-25 DIAGNOSIS — E46 Unspecified protein-calorie malnutrition: Secondary | ICD-10-CM | POA: Diagnosis not present

## 2015-11-25 DIAGNOSIS — K219 Gastro-esophageal reflux disease without esophagitis: Secondary | ICD-10-CM | POA: Diagnosis not present

## 2015-11-25 DIAGNOSIS — M549 Dorsalgia, unspecified: Secondary | ICD-10-CM | POA: Diagnosis not present

## 2015-11-25 DIAGNOSIS — Z681 Body mass index (BMI) 19 or less, adult: Secondary | ICD-10-CM | POA: Diagnosis not present

## 2015-11-25 DIAGNOSIS — E7800 Pure hypercholesterolemia, unspecified: Secondary | ICD-10-CM | POA: Diagnosis not present

## 2016-01-23 DIAGNOSIS — H524 Presbyopia: Secondary | ICD-10-CM | POA: Diagnosis not present

## 2016-02-23 DIAGNOSIS — H35371 Puckering of macula, right eye: Secondary | ICD-10-CM | POA: Diagnosis not present

## 2016-02-29 DIAGNOSIS — E7800 Pure hypercholesterolemia, unspecified: Secondary | ICD-10-CM | POA: Diagnosis not present

## 2016-02-29 DIAGNOSIS — G4700 Insomnia, unspecified: Secondary | ICD-10-CM | POA: Diagnosis not present

## 2016-02-29 DIAGNOSIS — E46 Unspecified protein-calorie malnutrition: Secondary | ICD-10-CM | POA: Diagnosis not present

## 2016-02-29 DIAGNOSIS — K219 Gastro-esophageal reflux disease without esophagitis: Secondary | ICD-10-CM | POA: Diagnosis not present

## 2016-02-29 DIAGNOSIS — M549 Dorsalgia, unspecified: Secondary | ICD-10-CM | POA: Diagnosis not present

## 2016-02-29 DIAGNOSIS — Z681 Body mass index (BMI) 19 or less, adult: Secondary | ICD-10-CM | POA: Diagnosis not present

## 2016-02-29 DIAGNOSIS — R911 Solitary pulmonary nodule: Secondary | ICD-10-CM | POA: Diagnosis not present

## 2016-02-29 DIAGNOSIS — J309 Allergic rhinitis, unspecified: Secondary | ICD-10-CM | POA: Diagnosis not present

## 2016-02-29 DIAGNOSIS — Z23 Encounter for immunization: Secondary | ICD-10-CM | POA: Diagnosis not present

## 2016-03-16 DIAGNOSIS — M549 Dorsalgia, unspecified: Secondary | ICD-10-CM | POA: Diagnosis not present

## 2016-03-16 DIAGNOSIS — R911 Solitary pulmonary nodule: Secondary | ICD-10-CM | POA: Diagnosis not present

## 2016-03-16 DIAGNOSIS — K219 Gastro-esophageal reflux disease without esophagitis: Secondary | ICD-10-CM | POA: Diagnosis not present

## 2016-03-16 DIAGNOSIS — J309 Allergic rhinitis, unspecified: Secondary | ICD-10-CM | POA: Diagnosis not present

## 2016-03-16 DIAGNOSIS — W57XXXA Bitten or stung by nonvenomous insect and other nonvenomous arthropods, initial encounter: Secondary | ICD-10-CM | POA: Diagnosis not present

## 2016-03-16 DIAGNOSIS — E46 Unspecified protein-calorie malnutrition: Secondary | ICD-10-CM | POA: Diagnosis not present

## 2016-03-16 DIAGNOSIS — Z6824 Body mass index (BMI) 24.0-24.9, adult: Secondary | ICD-10-CM | POA: Diagnosis not present

## 2016-03-16 DIAGNOSIS — E7800 Pure hypercholesterolemia, unspecified: Secondary | ICD-10-CM | POA: Diagnosis not present

## 2016-03-16 DIAGNOSIS — G4700 Insomnia, unspecified: Secondary | ICD-10-CM | POA: Diagnosis not present

## 2016-04-16 DIAGNOSIS — J309 Allergic rhinitis, unspecified: Secondary | ICD-10-CM | POA: Diagnosis not present

## 2016-04-16 DIAGNOSIS — E7800 Pure hypercholesterolemia, unspecified: Secondary | ICD-10-CM | POA: Diagnosis not present

## 2016-04-16 DIAGNOSIS — Z6824 Body mass index (BMI) 24.0-24.9, adult: Secondary | ICD-10-CM | POA: Diagnosis not present

## 2016-04-16 DIAGNOSIS — K219 Gastro-esophageal reflux disease without esophagitis: Secondary | ICD-10-CM | POA: Diagnosis not present

## 2016-04-16 DIAGNOSIS — M549 Dorsalgia, unspecified: Secondary | ICD-10-CM | POA: Diagnosis not present

## 2016-04-16 DIAGNOSIS — E46 Unspecified protein-calorie malnutrition: Secondary | ICD-10-CM | POA: Diagnosis not present

## 2016-04-16 DIAGNOSIS — R911 Solitary pulmonary nodule: Secondary | ICD-10-CM | POA: Diagnosis not present

## 2016-04-16 DIAGNOSIS — J209 Acute bronchitis, unspecified: Secondary | ICD-10-CM | POA: Diagnosis not present

## 2016-04-16 DIAGNOSIS — G4700 Insomnia, unspecified: Secondary | ICD-10-CM | POA: Diagnosis not present

## 2016-04-20 DIAGNOSIS — R911 Solitary pulmonary nodule: Secondary | ICD-10-CM | POA: Diagnosis not present

## 2016-04-20 DIAGNOSIS — K219 Gastro-esophageal reflux disease without esophagitis: Secondary | ICD-10-CM | POA: Diagnosis not present

## 2016-04-20 DIAGNOSIS — J309 Allergic rhinitis, unspecified: Secondary | ICD-10-CM | POA: Diagnosis not present

## 2016-04-20 DIAGNOSIS — E46 Unspecified protein-calorie malnutrition: Secondary | ICD-10-CM | POA: Diagnosis not present

## 2016-04-20 DIAGNOSIS — E7800 Pure hypercholesterolemia, unspecified: Secondary | ICD-10-CM | POA: Diagnosis not present

## 2016-04-20 DIAGNOSIS — M549 Dorsalgia, unspecified: Secondary | ICD-10-CM | POA: Diagnosis not present

## 2016-04-20 DIAGNOSIS — G4700 Insomnia, unspecified: Secondary | ICD-10-CM | POA: Diagnosis not present

## 2016-04-20 DIAGNOSIS — Z6824 Body mass index (BMI) 24.0-24.9, adult: Secondary | ICD-10-CM | POA: Diagnosis not present

## 2016-04-20 DIAGNOSIS — J209 Acute bronchitis, unspecified: Secondary | ICD-10-CM | POA: Diagnosis not present

## 2016-05-24 DIAGNOSIS — H35373 Puckering of macula, bilateral: Secondary | ICD-10-CM | POA: Diagnosis not present

## 2016-05-31 DIAGNOSIS — J309 Allergic rhinitis, unspecified: Secondary | ICD-10-CM | POA: Diagnosis not present

## 2016-05-31 DIAGNOSIS — K219 Gastro-esophageal reflux disease without esophagitis: Secondary | ICD-10-CM | POA: Diagnosis not present

## 2016-05-31 DIAGNOSIS — R911 Solitary pulmonary nodule: Secondary | ICD-10-CM | POA: Diagnosis not present

## 2016-05-31 DIAGNOSIS — E46 Unspecified protein-calorie malnutrition: Secondary | ICD-10-CM | POA: Diagnosis not present

## 2016-05-31 DIAGNOSIS — G4700 Insomnia, unspecified: Secondary | ICD-10-CM | POA: Diagnosis not present

## 2016-05-31 DIAGNOSIS — E7800 Pure hypercholesterolemia, unspecified: Secondary | ICD-10-CM | POA: Diagnosis not present

## 2016-05-31 DIAGNOSIS — M549 Dorsalgia, unspecified: Secondary | ICD-10-CM | POA: Diagnosis not present

## 2016-08-07 DIAGNOSIS — E7800 Pure hypercholesterolemia, unspecified: Secondary | ICD-10-CM | POA: Diagnosis not present

## 2016-08-07 DIAGNOSIS — M549 Dorsalgia, unspecified: Secondary | ICD-10-CM | POA: Diagnosis not present

## 2016-08-07 DIAGNOSIS — R911 Solitary pulmonary nodule: Secondary | ICD-10-CM | POA: Diagnosis not present

## 2016-08-07 DIAGNOSIS — Z6824 Body mass index (BMI) 24.0-24.9, adult: Secondary | ICD-10-CM | POA: Diagnosis not present

## 2016-08-07 DIAGNOSIS — J309 Allergic rhinitis, unspecified: Secondary | ICD-10-CM | POA: Diagnosis not present

## 2016-08-07 DIAGNOSIS — K219 Gastro-esophageal reflux disease without esophagitis: Secondary | ICD-10-CM | POA: Diagnosis not present

## 2016-08-07 DIAGNOSIS — I889 Nonspecific lymphadenitis, unspecified: Secondary | ICD-10-CM | POA: Diagnosis not present

## 2016-08-07 DIAGNOSIS — G4700 Insomnia, unspecified: Secondary | ICD-10-CM | POA: Diagnosis not present

## 2016-08-07 DIAGNOSIS — E46 Unspecified protein-calorie malnutrition: Secondary | ICD-10-CM | POA: Diagnosis not present

## 2016-08-23 DIAGNOSIS — H35373 Puckering of macula, bilateral: Secondary | ICD-10-CM | POA: Diagnosis not present

## 2016-08-28 DIAGNOSIS — G4700 Insomnia, unspecified: Secondary | ICD-10-CM | POA: Diagnosis not present

## 2016-08-28 DIAGNOSIS — M549 Dorsalgia, unspecified: Secondary | ICD-10-CM | POA: Diagnosis not present

## 2016-08-28 DIAGNOSIS — Z139 Encounter for screening, unspecified: Secondary | ICD-10-CM | POA: Diagnosis not present

## 2016-08-28 DIAGNOSIS — Z9181 History of falling: Secondary | ICD-10-CM | POA: Diagnosis not present

## 2016-08-28 DIAGNOSIS — E7800 Pure hypercholesterolemia, unspecified: Secondary | ICD-10-CM | POA: Diagnosis not present

## 2016-08-28 DIAGNOSIS — R911 Solitary pulmonary nodule: Secondary | ICD-10-CM | POA: Diagnosis not present

## 2016-08-28 DIAGNOSIS — K219 Gastro-esophageal reflux disease without esophagitis: Secondary | ICD-10-CM | POA: Diagnosis not present

## 2016-08-28 DIAGNOSIS — Z6824 Body mass index (BMI) 24.0-24.9, adult: Secondary | ICD-10-CM | POA: Diagnosis not present

## 2016-08-28 DIAGNOSIS — J309 Allergic rhinitis, unspecified: Secondary | ICD-10-CM | POA: Diagnosis not present

## 2016-11-28 DIAGNOSIS — J309 Allergic rhinitis, unspecified: Secondary | ICD-10-CM | POA: Diagnosis not present

## 2016-11-28 DIAGNOSIS — R911 Solitary pulmonary nodule: Secondary | ICD-10-CM | POA: Diagnosis not present

## 2016-11-28 DIAGNOSIS — R42 Dizziness and giddiness: Secondary | ICD-10-CM | POA: Diagnosis not present

## 2016-11-28 DIAGNOSIS — G4700 Insomnia, unspecified: Secondary | ICD-10-CM | POA: Diagnosis not present

## 2016-11-28 DIAGNOSIS — K219 Gastro-esophageal reflux disease without esophagitis: Secondary | ICD-10-CM | POA: Diagnosis not present

## 2016-11-28 DIAGNOSIS — E7800 Pure hypercholesterolemia, unspecified: Secondary | ICD-10-CM | POA: Diagnosis not present

## 2016-11-28 DIAGNOSIS — E46 Unspecified protein-calorie malnutrition: Secondary | ICD-10-CM | POA: Diagnosis not present

## 2016-11-28 DIAGNOSIS — M549 Dorsalgia, unspecified: Secondary | ICD-10-CM | POA: Diagnosis not present

## 2016-11-28 DIAGNOSIS — I8390 Asymptomatic varicose veins of unspecified lower extremity: Secondary | ICD-10-CM | POA: Diagnosis not present

## 2016-12-14 DIAGNOSIS — I83893 Varicose veins of bilateral lower extremities with other complications: Secondary | ICD-10-CM | POA: Diagnosis not present

## 2016-12-14 DIAGNOSIS — I83813 Varicose veins of bilateral lower extremities with pain: Secondary | ICD-10-CM | POA: Diagnosis not present

## 2016-12-19 DIAGNOSIS — I83893 Varicose veins of bilateral lower extremities with other complications: Secondary | ICD-10-CM | POA: Diagnosis not present

## 2016-12-19 DIAGNOSIS — I83813 Varicose veins of bilateral lower extremities with pain: Secondary | ICD-10-CM | POA: Diagnosis not present

## 2016-12-21 DIAGNOSIS — I8311 Varicose veins of right lower extremity with inflammation: Secondary | ICD-10-CM | POA: Diagnosis not present

## 2016-12-21 DIAGNOSIS — I8312 Varicose veins of left lower extremity with inflammation: Secondary | ICD-10-CM | POA: Diagnosis not present

## 2016-12-24 DIAGNOSIS — G4700 Insomnia, unspecified: Secondary | ICD-10-CM | POA: Diagnosis not present

## 2016-12-24 DIAGNOSIS — E7800 Pure hypercholesterolemia, unspecified: Secondary | ICD-10-CM | POA: Diagnosis not present

## 2016-12-24 DIAGNOSIS — R42 Dizziness and giddiness: Secondary | ICD-10-CM | POA: Diagnosis not present

## 2016-12-24 DIAGNOSIS — K219 Gastro-esophageal reflux disease without esophagitis: Secondary | ICD-10-CM | POA: Diagnosis not present

## 2016-12-24 DIAGNOSIS — E46 Unspecified protein-calorie malnutrition: Secondary | ICD-10-CM | POA: Diagnosis not present

## 2016-12-24 DIAGNOSIS — J309 Allergic rhinitis, unspecified: Secondary | ICD-10-CM | POA: Diagnosis not present

## 2016-12-24 DIAGNOSIS — I8390 Asymptomatic varicose veins of unspecified lower extremity: Secondary | ICD-10-CM | POA: Diagnosis not present

## 2016-12-24 DIAGNOSIS — R911 Solitary pulmonary nodule: Secondary | ICD-10-CM | POA: Diagnosis not present

## 2016-12-24 DIAGNOSIS — M549 Dorsalgia, unspecified: Secondary | ICD-10-CM | POA: Diagnosis not present

## 2017-01-04 DIAGNOSIS — I8390 Asymptomatic varicose veins of unspecified lower extremity: Secondary | ICD-10-CM | POA: Diagnosis not present

## 2017-01-04 DIAGNOSIS — E7800 Pure hypercholesterolemia, unspecified: Secondary | ICD-10-CM | POA: Diagnosis not present

## 2017-01-04 DIAGNOSIS — K219 Gastro-esophageal reflux disease without esophagitis: Secondary | ICD-10-CM | POA: Diagnosis not present

## 2017-01-04 DIAGNOSIS — I889 Nonspecific lymphadenitis, unspecified: Secondary | ICD-10-CM | POA: Diagnosis not present

## 2017-01-04 DIAGNOSIS — Z6824 Body mass index (BMI) 24.0-24.9, adult: Secondary | ICD-10-CM | POA: Diagnosis not present

## 2017-01-04 DIAGNOSIS — E46 Unspecified protein-calorie malnutrition: Secondary | ICD-10-CM | POA: Diagnosis not present

## 2017-01-04 DIAGNOSIS — J309 Allergic rhinitis, unspecified: Secondary | ICD-10-CM | POA: Diagnosis not present

## 2017-01-04 DIAGNOSIS — R911 Solitary pulmonary nodule: Secondary | ICD-10-CM | POA: Diagnosis not present

## 2017-01-04 DIAGNOSIS — G4700 Insomnia, unspecified: Secondary | ICD-10-CM | POA: Diagnosis not present

## 2017-01-18 DIAGNOSIS — I8312 Varicose veins of left lower extremity with inflammation: Secondary | ICD-10-CM | POA: Diagnosis not present

## 2017-02-06 DIAGNOSIS — M7981 Nontraumatic hematoma of soft tissue: Secondary | ICD-10-CM | POA: Diagnosis not present

## 2017-02-06 DIAGNOSIS — I8312 Varicose veins of left lower extremity with inflammation: Secondary | ICD-10-CM | POA: Diagnosis not present

## 2017-02-20 DIAGNOSIS — I8311 Varicose veins of right lower extremity with inflammation: Secondary | ICD-10-CM | POA: Diagnosis not present

## 2017-02-20 DIAGNOSIS — M7981 Nontraumatic hematoma of soft tissue: Secondary | ICD-10-CM | POA: Diagnosis not present

## 2017-02-25 DIAGNOSIS — H524 Presbyopia: Secondary | ICD-10-CM | POA: Diagnosis not present

## 2017-03-27 DIAGNOSIS — I8312 Varicose veins of left lower extremity with inflammation: Secondary | ICD-10-CM | POA: Diagnosis not present

## 2017-05-28 DIAGNOSIS — K219 Gastro-esophageal reflux disease without esophagitis: Secondary | ICD-10-CM | POA: Diagnosis not present

## 2017-05-28 DIAGNOSIS — Z23 Encounter for immunization: Secondary | ICD-10-CM | POA: Diagnosis not present

## 2017-05-28 DIAGNOSIS — R42 Dizziness and giddiness: Secondary | ICD-10-CM | POA: Diagnosis not present

## 2017-05-28 DIAGNOSIS — G4700 Insomnia, unspecified: Secondary | ICD-10-CM | POA: Diagnosis not present

## 2017-05-28 DIAGNOSIS — E7800 Pure hypercholesterolemia, unspecified: Secondary | ICD-10-CM | POA: Diagnosis not present

## 2017-05-28 DIAGNOSIS — J309 Allergic rhinitis, unspecified: Secondary | ICD-10-CM | POA: Diagnosis not present

## 2017-05-28 DIAGNOSIS — R030 Elevated blood-pressure reading, without diagnosis of hypertension: Secondary | ICD-10-CM | POA: Diagnosis not present

## 2017-05-28 DIAGNOSIS — H6122 Impacted cerumen, left ear: Secondary | ICD-10-CM | POA: Diagnosis not present

## 2017-05-28 DIAGNOSIS — R911 Solitary pulmonary nodule: Secondary | ICD-10-CM | POA: Diagnosis not present

## 2017-05-30 DIAGNOSIS — H6120 Impacted cerumen, unspecified ear: Secondary | ICD-10-CM | POA: Diagnosis not present

## 2017-06-10 DIAGNOSIS — R911 Solitary pulmonary nodule: Secondary | ICD-10-CM | POA: Diagnosis not present

## 2017-06-10 DIAGNOSIS — Z23 Encounter for immunization: Secondary | ICD-10-CM | POA: Diagnosis not present

## 2017-06-10 DIAGNOSIS — R42 Dizziness and giddiness: Secondary | ICD-10-CM | POA: Diagnosis not present

## 2017-06-10 DIAGNOSIS — J309 Allergic rhinitis, unspecified: Secondary | ICD-10-CM | POA: Diagnosis not present

## 2017-06-10 DIAGNOSIS — E7800 Pure hypercholesterolemia, unspecified: Secondary | ICD-10-CM | POA: Diagnosis not present

## 2017-06-10 DIAGNOSIS — I8390 Asymptomatic varicose veins of unspecified lower extremity: Secondary | ICD-10-CM | POA: Diagnosis not present

## 2017-06-10 DIAGNOSIS — G4700 Insomnia, unspecified: Secondary | ICD-10-CM | POA: Diagnosis not present

## 2017-06-10 DIAGNOSIS — K219 Gastro-esophageal reflux disease without esophagitis: Secondary | ICD-10-CM | POA: Diagnosis not present

## 2017-06-10 DIAGNOSIS — E46 Unspecified protein-calorie malnutrition: Secondary | ICD-10-CM | POA: Diagnosis not present

## 2017-07-16 DIAGNOSIS — Z139 Encounter for screening, unspecified: Secondary | ICD-10-CM | POA: Diagnosis not present

## 2017-07-16 DIAGNOSIS — E785 Hyperlipidemia, unspecified: Secondary | ICD-10-CM | POA: Diagnosis not present

## 2017-07-16 DIAGNOSIS — Z1331 Encounter for screening for depression: Secondary | ICD-10-CM | POA: Diagnosis not present

## 2017-07-16 DIAGNOSIS — Z0000 Encounter for general adult medical examination without abnormal findings: Secondary | ICD-10-CM | POA: Diagnosis not present

## 2017-07-16 DIAGNOSIS — Z9181 History of falling: Secondary | ICD-10-CM | POA: Diagnosis not present

## 2017-09-10 DIAGNOSIS — E46 Unspecified protein-calorie malnutrition: Secondary | ICD-10-CM | POA: Diagnosis not present

## 2017-09-10 DIAGNOSIS — R42 Dizziness and giddiness: Secondary | ICD-10-CM | POA: Diagnosis not present

## 2017-09-10 DIAGNOSIS — I8390 Asymptomatic varicose veins of unspecified lower extremity: Secondary | ICD-10-CM | POA: Diagnosis not present

## 2017-09-10 DIAGNOSIS — E7800 Pure hypercholesterolemia, unspecified: Secondary | ICD-10-CM | POA: Diagnosis not present

## 2017-09-10 DIAGNOSIS — K219 Gastro-esophageal reflux disease without esophagitis: Secondary | ICD-10-CM | POA: Diagnosis not present

## 2017-09-10 DIAGNOSIS — R911 Solitary pulmonary nodule: Secondary | ICD-10-CM | POA: Diagnosis not present

## 2017-09-10 DIAGNOSIS — R030 Elevated blood-pressure reading, without diagnosis of hypertension: Secondary | ICD-10-CM | POA: Diagnosis not present

## 2017-09-10 DIAGNOSIS — Z1331 Encounter for screening for depression: Secondary | ICD-10-CM | POA: Diagnosis not present

## 2017-09-10 DIAGNOSIS — Z1389 Encounter for screening for other disorder: Secondary | ICD-10-CM | POA: Diagnosis not present

## 2017-09-10 DIAGNOSIS — J309 Allergic rhinitis, unspecified: Secondary | ICD-10-CM | POA: Diagnosis not present

## 2017-09-10 DIAGNOSIS — G4700 Insomnia, unspecified: Secondary | ICD-10-CM | POA: Diagnosis not present

## 2017-09-19 DIAGNOSIS — Z1331 Encounter for screening for depression: Secondary | ICD-10-CM | POA: Diagnosis not present

## 2017-09-19 DIAGNOSIS — R911 Solitary pulmonary nodule: Secondary | ICD-10-CM | POA: Diagnosis not present

## 2017-09-19 DIAGNOSIS — M25512 Pain in left shoulder: Secondary | ICD-10-CM | POA: Diagnosis not present

## 2017-09-19 DIAGNOSIS — R42 Dizziness and giddiness: Secondary | ICD-10-CM | POA: Diagnosis not present

## 2017-09-19 DIAGNOSIS — K219 Gastro-esophageal reflux disease without esophagitis: Secondary | ICD-10-CM | POA: Diagnosis not present

## 2017-09-19 DIAGNOSIS — J309 Allergic rhinitis, unspecified: Secondary | ICD-10-CM | POA: Diagnosis not present

## 2017-09-19 DIAGNOSIS — E46 Unspecified protein-calorie malnutrition: Secondary | ICD-10-CM | POA: Diagnosis not present

## 2017-09-19 DIAGNOSIS — I8390 Asymptomatic varicose veins of unspecified lower extremity: Secondary | ICD-10-CM | POA: Diagnosis not present

## 2017-09-19 DIAGNOSIS — G4700 Insomnia, unspecified: Secondary | ICD-10-CM | POA: Diagnosis not present

## 2017-09-19 DIAGNOSIS — Z1389 Encounter for screening for other disorder: Secondary | ICD-10-CM | POA: Diagnosis not present

## 2017-11-06 DIAGNOSIS — G4700 Insomnia, unspecified: Secondary | ICD-10-CM | POA: Diagnosis not present

## 2017-11-06 DIAGNOSIS — R030 Elevated blood-pressure reading, without diagnosis of hypertension: Secondary | ICD-10-CM | POA: Diagnosis not present

## 2017-11-06 DIAGNOSIS — K219 Gastro-esophageal reflux disease without esophagitis: Secondary | ICD-10-CM | POA: Diagnosis not present

## 2017-11-06 DIAGNOSIS — R42 Dizziness and giddiness: Secondary | ICD-10-CM | POA: Diagnosis not present

## 2017-11-06 DIAGNOSIS — Z6824 Body mass index (BMI) 24.0-24.9, adult: Secondary | ICD-10-CM | POA: Diagnosis not present

## 2017-11-06 DIAGNOSIS — E46 Unspecified protein-calorie malnutrition: Secondary | ICD-10-CM | POA: Diagnosis not present

## 2017-11-06 DIAGNOSIS — J309 Allergic rhinitis, unspecified: Secondary | ICD-10-CM | POA: Diagnosis not present

## 2017-11-06 DIAGNOSIS — I8390 Asymptomatic varicose veins of unspecified lower extremity: Secondary | ICD-10-CM | POA: Diagnosis not present

## 2017-11-06 DIAGNOSIS — R911 Solitary pulmonary nodule: Secondary | ICD-10-CM | POA: Diagnosis not present

## 2017-11-25 DIAGNOSIS — G4700 Insomnia, unspecified: Secondary | ICD-10-CM | POA: Diagnosis not present

## 2017-11-25 DIAGNOSIS — R42 Dizziness and giddiness: Secondary | ICD-10-CM | POA: Diagnosis not present

## 2017-11-25 DIAGNOSIS — R911 Solitary pulmonary nodule: Secondary | ICD-10-CM | POA: Diagnosis not present

## 2017-11-25 DIAGNOSIS — E7800 Pure hypercholesterolemia, unspecified: Secondary | ICD-10-CM | POA: Diagnosis not present

## 2017-11-25 DIAGNOSIS — M545 Low back pain: Secondary | ICD-10-CM | POA: Diagnosis not present

## 2017-11-25 DIAGNOSIS — E46 Unspecified protein-calorie malnutrition: Secondary | ICD-10-CM | POA: Diagnosis not present

## 2017-11-25 DIAGNOSIS — R030 Elevated blood-pressure reading, without diagnosis of hypertension: Secondary | ICD-10-CM | POA: Diagnosis not present

## 2017-11-25 DIAGNOSIS — I8390 Asymptomatic varicose veins of unspecified lower extremity: Secondary | ICD-10-CM | POA: Diagnosis not present

## 2017-11-25 DIAGNOSIS — J309 Allergic rhinitis, unspecified: Secondary | ICD-10-CM | POA: Diagnosis not present

## 2017-11-27 DIAGNOSIS — R1013 Epigastric pain: Secondary | ICD-10-CM | POA: Diagnosis not present

## 2017-11-27 DIAGNOSIS — K5730 Diverticulosis of large intestine without perforation or abscess without bleeding: Secondary | ICD-10-CM | POA: Diagnosis not present

## 2017-11-27 DIAGNOSIS — K219 Gastro-esophageal reflux disease without esophagitis: Secondary | ICD-10-CM | POA: Diagnosis not present

## 2017-11-28 DIAGNOSIS — I8390 Asymptomatic varicose veins of unspecified lower extremity: Secondary | ICD-10-CM | POA: Diagnosis not present

## 2017-11-28 DIAGNOSIS — R42 Dizziness and giddiness: Secondary | ICD-10-CM | POA: Diagnosis not present

## 2017-11-28 DIAGNOSIS — M545 Low back pain: Secondary | ICD-10-CM | POA: Diagnosis not present

## 2017-11-28 DIAGNOSIS — E46 Unspecified protein-calorie malnutrition: Secondary | ICD-10-CM | POA: Diagnosis not present

## 2017-11-28 DIAGNOSIS — R030 Elevated blood-pressure reading, without diagnosis of hypertension: Secondary | ICD-10-CM | POA: Diagnosis not present

## 2017-11-28 DIAGNOSIS — E7800 Pure hypercholesterolemia, unspecified: Secondary | ICD-10-CM | POA: Diagnosis not present

## 2017-11-28 DIAGNOSIS — R911 Solitary pulmonary nodule: Secondary | ICD-10-CM | POA: Diagnosis not present

## 2017-11-28 DIAGNOSIS — J309 Allergic rhinitis, unspecified: Secondary | ICD-10-CM | POA: Diagnosis not present

## 2017-11-28 DIAGNOSIS — G4700 Insomnia, unspecified: Secondary | ICD-10-CM | POA: Diagnosis not present

## 2017-12-09 DIAGNOSIS — M4126 Other idiopathic scoliosis, lumbar region: Secondary | ICD-10-CM | POA: Diagnosis not present

## 2017-12-09 DIAGNOSIS — M9905 Segmental and somatic dysfunction of pelvic region: Secondary | ICD-10-CM | POA: Diagnosis not present

## 2017-12-09 DIAGNOSIS — M9902 Segmental and somatic dysfunction of thoracic region: Secondary | ICD-10-CM | POA: Diagnosis not present

## 2017-12-09 DIAGNOSIS — M5387 Other specified dorsopathies, lumbosacral region: Secondary | ICD-10-CM | POA: Diagnosis not present

## 2017-12-09 DIAGNOSIS — S336XXA Sprain of sacroiliac joint, initial encounter: Secondary | ICD-10-CM | POA: Diagnosis not present

## 2017-12-09 DIAGNOSIS — M9903 Segmental and somatic dysfunction of lumbar region: Secondary | ICD-10-CM | POA: Diagnosis not present

## 2017-12-11 DIAGNOSIS — M9902 Segmental and somatic dysfunction of thoracic region: Secondary | ICD-10-CM | POA: Diagnosis not present

## 2017-12-11 DIAGNOSIS — M5387 Other specified dorsopathies, lumbosacral region: Secondary | ICD-10-CM | POA: Diagnosis not present

## 2017-12-11 DIAGNOSIS — M9905 Segmental and somatic dysfunction of pelvic region: Secondary | ICD-10-CM | POA: Diagnosis not present

## 2017-12-11 DIAGNOSIS — M9903 Segmental and somatic dysfunction of lumbar region: Secondary | ICD-10-CM | POA: Diagnosis not present

## 2017-12-11 DIAGNOSIS — M4126 Other idiopathic scoliosis, lumbar region: Secondary | ICD-10-CM | POA: Diagnosis not present

## 2017-12-11 DIAGNOSIS — S336XXA Sprain of sacroiliac joint, initial encounter: Secondary | ICD-10-CM | POA: Diagnosis not present

## 2017-12-13 DIAGNOSIS — E7800 Pure hypercholesterolemia, unspecified: Secondary | ICD-10-CM | POA: Diagnosis not present

## 2017-12-13 DIAGNOSIS — I8390 Asymptomatic varicose veins of unspecified lower extremity: Secondary | ICD-10-CM | POA: Diagnosis not present

## 2017-12-13 DIAGNOSIS — R030 Elevated blood-pressure reading, without diagnosis of hypertension: Secondary | ICD-10-CM | POA: Diagnosis not present

## 2017-12-13 DIAGNOSIS — G4700 Insomnia, unspecified: Secondary | ICD-10-CM | POA: Diagnosis not present

## 2017-12-13 DIAGNOSIS — E46 Unspecified protein-calorie malnutrition: Secondary | ICD-10-CM | POA: Diagnosis not present

## 2017-12-13 DIAGNOSIS — R42 Dizziness and giddiness: Secondary | ICD-10-CM | POA: Diagnosis not present

## 2017-12-13 DIAGNOSIS — M545 Low back pain: Secondary | ICD-10-CM | POA: Diagnosis not present

## 2017-12-13 DIAGNOSIS — J309 Allergic rhinitis, unspecified: Secondary | ICD-10-CM | POA: Diagnosis not present

## 2017-12-13 DIAGNOSIS — R911 Solitary pulmonary nodule: Secondary | ICD-10-CM | POA: Diagnosis not present

## 2017-12-17 DIAGNOSIS — M9903 Segmental and somatic dysfunction of lumbar region: Secondary | ICD-10-CM | POA: Diagnosis not present

## 2017-12-17 DIAGNOSIS — M9905 Segmental and somatic dysfunction of pelvic region: Secondary | ICD-10-CM | POA: Diagnosis not present

## 2017-12-17 DIAGNOSIS — M4126 Other idiopathic scoliosis, lumbar region: Secondary | ICD-10-CM | POA: Diagnosis not present

## 2017-12-17 DIAGNOSIS — M5387 Other specified dorsopathies, lumbosacral region: Secondary | ICD-10-CM | POA: Diagnosis not present

## 2017-12-17 DIAGNOSIS — M9902 Segmental and somatic dysfunction of thoracic region: Secondary | ICD-10-CM | POA: Diagnosis not present

## 2017-12-17 DIAGNOSIS — S336XXA Sprain of sacroiliac joint, initial encounter: Secondary | ICD-10-CM | POA: Diagnosis not present

## 2017-12-24 DIAGNOSIS — M5387 Other specified dorsopathies, lumbosacral region: Secondary | ICD-10-CM | POA: Diagnosis not present

## 2017-12-24 DIAGNOSIS — M9905 Segmental and somatic dysfunction of pelvic region: Secondary | ICD-10-CM | POA: Diagnosis not present

## 2017-12-24 DIAGNOSIS — S336XXA Sprain of sacroiliac joint, initial encounter: Secondary | ICD-10-CM | POA: Diagnosis not present

## 2017-12-24 DIAGNOSIS — M9902 Segmental and somatic dysfunction of thoracic region: Secondary | ICD-10-CM | POA: Diagnosis not present

## 2017-12-24 DIAGNOSIS — M4126 Other idiopathic scoliosis, lumbar region: Secondary | ICD-10-CM | POA: Diagnosis not present

## 2017-12-24 DIAGNOSIS — M9903 Segmental and somatic dysfunction of lumbar region: Secondary | ICD-10-CM | POA: Diagnosis not present

## 2017-12-31 DIAGNOSIS — M9905 Segmental and somatic dysfunction of pelvic region: Secondary | ICD-10-CM | POA: Diagnosis not present

## 2017-12-31 DIAGNOSIS — S336XXA Sprain of sacroiliac joint, initial encounter: Secondary | ICD-10-CM | POA: Diagnosis not present

## 2017-12-31 DIAGNOSIS — M5387 Other specified dorsopathies, lumbosacral region: Secondary | ICD-10-CM | POA: Diagnosis not present

## 2017-12-31 DIAGNOSIS — M9903 Segmental and somatic dysfunction of lumbar region: Secondary | ICD-10-CM | POA: Diagnosis not present

## 2017-12-31 DIAGNOSIS — M9902 Segmental and somatic dysfunction of thoracic region: Secondary | ICD-10-CM | POA: Diagnosis not present

## 2017-12-31 DIAGNOSIS — M4126 Other idiopathic scoliosis, lumbar region: Secondary | ICD-10-CM | POA: Diagnosis not present

## 2018-01-01 DIAGNOSIS — G4700 Insomnia, unspecified: Secondary | ICD-10-CM | POA: Diagnosis not present

## 2018-01-01 DIAGNOSIS — R42 Dizziness and giddiness: Secondary | ICD-10-CM | POA: Diagnosis not present

## 2018-01-01 DIAGNOSIS — R030 Elevated blood-pressure reading, without diagnosis of hypertension: Secondary | ICD-10-CM | POA: Diagnosis not present

## 2018-01-01 DIAGNOSIS — M545 Low back pain: Secondary | ICD-10-CM | POA: Diagnosis not present

## 2018-01-01 DIAGNOSIS — R911 Solitary pulmonary nodule: Secondary | ICD-10-CM | POA: Diagnosis not present

## 2018-01-01 DIAGNOSIS — J309 Allergic rhinitis, unspecified: Secondary | ICD-10-CM | POA: Diagnosis not present

## 2018-01-01 DIAGNOSIS — I8390 Asymptomatic varicose veins of unspecified lower extremity: Secondary | ICD-10-CM | POA: Diagnosis not present

## 2018-01-01 DIAGNOSIS — E7800 Pure hypercholesterolemia, unspecified: Secondary | ICD-10-CM | POA: Diagnosis not present

## 2018-01-01 DIAGNOSIS — E46 Unspecified protein-calorie malnutrition: Secondary | ICD-10-CM | POA: Diagnosis not present

## 2018-01-14 DIAGNOSIS — M9905 Segmental and somatic dysfunction of pelvic region: Secondary | ICD-10-CM | POA: Diagnosis not present

## 2018-01-14 DIAGNOSIS — S336XXA Sprain of sacroiliac joint, initial encounter: Secondary | ICD-10-CM | POA: Diagnosis not present

## 2018-01-14 DIAGNOSIS — M9902 Segmental and somatic dysfunction of thoracic region: Secondary | ICD-10-CM | POA: Diagnosis not present

## 2018-01-14 DIAGNOSIS — M4126 Other idiopathic scoliosis, lumbar region: Secondary | ICD-10-CM | POA: Diagnosis not present

## 2018-01-14 DIAGNOSIS — M9903 Segmental and somatic dysfunction of lumbar region: Secondary | ICD-10-CM | POA: Diagnosis not present

## 2018-01-14 DIAGNOSIS — M5387 Other specified dorsopathies, lumbosacral region: Secondary | ICD-10-CM | POA: Diagnosis not present

## 2018-02-11 DIAGNOSIS — M9905 Segmental and somatic dysfunction of pelvic region: Secondary | ICD-10-CM | POA: Diagnosis not present

## 2018-02-11 DIAGNOSIS — S336XXA Sprain of sacroiliac joint, initial encounter: Secondary | ICD-10-CM | POA: Diagnosis not present

## 2018-02-11 DIAGNOSIS — M4126 Other idiopathic scoliosis, lumbar region: Secondary | ICD-10-CM | POA: Diagnosis not present

## 2018-02-11 DIAGNOSIS — M9903 Segmental and somatic dysfunction of lumbar region: Secondary | ICD-10-CM | POA: Diagnosis not present

## 2018-02-11 DIAGNOSIS — M5387 Other specified dorsopathies, lumbosacral region: Secondary | ICD-10-CM | POA: Diagnosis not present

## 2018-02-11 DIAGNOSIS — M9902 Segmental and somatic dysfunction of thoracic region: Secondary | ICD-10-CM | POA: Diagnosis not present

## 2018-03-11 DIAGNOSIS — M9905 Segmental and somatic dysfunction of pelvic region: Secondary | ICD-10-CM | POA: Diagnosis not present

## 2018-03-11 DIAGNOSIS — M9903 Segmental and somatic dysfunction of lumbar region: Secondary | ICD-10-CM | POA: Diagnosis not present

## 2018-03-11 DIAGNOSIS — M4126 Other idiopathic scoliosis, lumbar region: Secondary | ICD-10-CM | POA: Diagnosis not present

## 2018-03-11 DIAGNOSIS — M9902 Segmental and somatic dysfunction of thoracic region: Secondary | ICD-10-CM | POA: Diagnosis not present

## 2018-03-20 DIAGNOSIS — R42 Dizziness and giddiness: Secondary | ICD-10-CM | POA: Diagnosis not present

## 2018-03-20 DIAGNOSIS — I8390 Asymptomatic varicose veins of unspecified lower extremity: Secondary | ICD-10-CM | POA: Diagnosis not present

## 2018-03-20 DIAGNOSIS — G4700 Insomnia, unspecified: Secondary | ICD-10-CM | POA: Diagnosis not present

## 2018-03-20 DIAGNOSIS — E46 Unspecified protein-calorie malnutrition: Secondary | ICD-10-CM | POA: Diagnosis not present

## 2018-03-20 DIAGNOSIS — Z1339 Encounter for screening examination for other mental health and behavioral disorders: Secondary | ICD-10-CM | POA: Diagnosis not present

## 2018-03-20 DIAGNOSIS — R911 Solitary pulmonary nodule: Secondary | ICD-10-CM | POA: Diagnosis not present

## 2018-03-20 DIAGNOSIS — J309 Allergic rhinitis, unspecified: Secondary | ICD-10-CM | POA: Diagnosis not present

## 2018-03-20 DIAGNOSIS — M545 Low back pain: Secondary | ICD-10-CM | POA: Diagnosis not present

## 2018-03-20 DIAGNOSIS — E7800 Pure hypercholesterolemia, unspecified: Secondary | ICD-10-CM | POA: Diagnosis not present

## 2018-04-14 DIAGNOSIS — G4700 Insomnia, unspecified: Secondary | ICD-10-CM | POA: Diagnosis not present

## 2018-04-14 DIAGNOSIS — E46 Unspecified protein-calorie malnutrition: Secondary | ICD-10-CM | POA: Diagnosis not present

## 2018-04-14 DIAGNOSIS — J309 Allergic rhinitis, unspecified: Secondary | ICD-10-CM | POA: Diagnosis not present

## 2018-04-14 DIAGNOSIS — K1120 Sialoadenitis, unspecified: Secondary | ICD-10-CM | POA: Diagnosis not present

## 2018-04-14 DIAGNOSIS — M545 Low back pain: Secondary | ICD-10-CM | POA: Diagnosis not present

## 2018-04-14 DIAGNOSIS — R911 Solitary pulmonary nodule: Secondary | ICD-10-CM | POA: Diagnosis not present

## 2018-04-14 DIAGNOSIS — R030 Elevated blood-pressure reading, without diagnosis of hypertension: Secondary | ICD-10-CM | POA: Diagnosis not present

## 2018-04-14 DIAGNOSIS — R42 Dizziness and giddiness: Secondary | ICD-10-CM | POA: Diagnosis not present

## 2018-04-14 DIAGNOSIS — I8390 Asymptomatic varicose veins of unspecified lower extremity: Secondary | ICD-10-CM | POA: Diagnosis not present

## 2018-04-28 DIAGNOSIS — J309 Allergic rhinitis, unspecified: Secondary | ICD-10-CM | POA: Diagnosis not present

## 2018-04-28 DIAGNOSIS — R911 Solitary pulmonary nodule: Secondary | ICD-10-CM | POA: Diagnosis not present

## 2018-04-28 DIAGNOSIS — E46 Unspecified protein-calorie malnutrition: Secondary | ICD-10-CM | POA: Diagnosis not present

## 2018-04-28 DIAGNOSIS — G4700 Insomnia, unspecified: Secondary | ICD-10-CM | POA: Diagnosis not present

## 2018-04-28 DIAGNOSIS — M545 Low back pain: Secondary | ICD-10-CM | POA: Diagnosis not present

## 2018-04-28 DIAGNOSIS — R030 Elevated blood-pressure reading, without diagnosis of hypertension: Secondary | ICD-10-CM | POA: Diagnosis not present

## 2018-04-28 DIAGNOSIS — I8390 Asymptomatic varicose veins of unspecified lower extremity: Secondary | ICD-10-CM | POA: Diagnosis not present

## 2018-04-28 DIAGNOSIS — R42 Dizziness and giddiness: Secondary | ICD-10-CM | POA: Diagnosis not present

## 2018-04-28 DIAGNOSIS — K219 Gastro-esophageal reflux disease without esophagitis: Secondary | ICD-10-CM | POA: Diagnosis not present

## 2018-06-06 DIAGNOSIS — R911 Solitary pulmonary nodule: Secondary | ICD-10-CM | POA: Diagnosis not present

## 2018-06-06 DIAGNOSIS — R030 Elevated blood-pressure reading, without diagnosis of hypertension: Secondary | ICD-10-CM | POA: Diagnosis not present

## 2018-06-06 DIAGNOSIS — G4700 Insomnia, unspecified: Secondary | ICD-10-CM | POA: Diagnosis not present

## 2018-06-06 DIAGNOSIS — M545 Low back pain: Secondary | ICD-10-CM | POA: Diagnosis not present

## 2018-06-06 DIAGNOSIS — E46 Unspecified protein-calorie malnutrition: Secondary | ICD-10-CM | POA: Diagnosis not present

## 2018-06-06 DIAGNOSIS — N390 Urinary tract infection, site not specified: Secondary | ICD-10-CM | POA: Diagnosis not present

## 2018-06-06 DIAGNOSIS — R42 Dizziness and giddiness: Secondary | ICD-10-CM | POA: Diagnosis not present

## 2018-06-06 DIAGNOSIS — J309 Allergic rhinitis, unspecified: Secondary | ICD-10-CM | POA: Diagnosis not present

## 2018-06-06 DIAGNOSIS — I8390 Asymptomatic varicose veins of unspecified lower extremity: Secondary | ICD-10-CM | POA: Diagnosis not present

## 2018-06-26 DIAGNOSIS — R42 Dizziness and giddiness: Secondary | ICD-10-CM | POA: Diagnosis not present

## 2018-06-26 DIAGNOSIS — E46 Unspecified protein-calorie malnutrition: Secondary | ICD-10-CM | POA: Diagnosis not present

## 2018-06-26 DIAGNOSIS — R030 Elevated blood-pressure reading, without diagnosis of hypertension: Secondary | ICD-10-CM | POA: Diagnosis not present

## 2018-06-26 DIAGNOSIS — R911 Solitary pulmonary nodule: Secondary | ICD-10-CM | POA: Diagnosis not present

## 2018-06-26 DIAGNOSIS — E7800 Pure hypercholesterolemia, unspecified: Secondary | ICD-10-CM | POA: Diagnosis not present

## 2018-06-26 DIAGNOSIS — G4700 Insomnia, unspecified: Secondary | ICD-10-CM | POA: Diagnosis not present

## 2018-06-26 DIAGNOSIS — M545 Low back pain: Secondary | ICD-10-CM | POA: Diagnosis not present

## 2018-06-26 DIAGNOSIS — J309 Allergic rhinitis, unspecified: Secondary | ICD-10-CM | POA: Diagnosis not present

## 2018-06-26 DIAGNOSIS — I8390 Asymptomatic varicose veins of unspecified lower extremity: Secondary | ICD-10-CM | POA: Diagnosis not present

## 2018-07-29 DIAGNOSIS — Z0000 Encounter for general adult medical examination without abnormal findings: Secondary | ICD-10-CM | POA: Diagnosis not present

## 2018-07-29 DIAGNOSIS — Z9181 History of falling: Secondary | ICD-10-CM | POA: Diagnosis not present

## 2018-07-29 DIAGNOSIS — Z1331 Encounter for screening for depression: Secondary | ICD-10-CM | POA: Diagnosis not present

## 2018-07-29 DIAGNOSIS — E785 Hyperlipidemia, unspecified: Secondary | ICD-10-CM | POA: Diagnosis not present

## 2018-07-29 DIAGNOSIS — Z139 Encounter for screening, unspecified: Secondary | ICD-10-CM | POA: Diagnosis not present

## 2018-09-27 DIAGNOSIS — I8390 Asymptomatic varicose veins of unspecified lower extremity: Secondary | ICD-10-CM | POA: Diagnosis not present

## 2018-09-27 DIAGNOSIS — J309 Allergic rhinitis, unspecified: Secondary | ICD-10-CM | POA: Diagnosis not present

## 2018-09-27 DIAGNOSIS — R911 Solitary pulmonary nodule: Secondary | ICD-10-CM | POA: Diagnosis not present

## 2018-09-27 DIAGNOSIS — E46 Unspecified protein-calorie malnutrition: Secondary | ICD-10-CM | POA: Diagnosis not present

## 2018-09-27 DIAGNOSIS — G4700 Insomnia, unspecified: Secondary | ICD-10-CM | POA: Diagnosis not present

## 2018-09-27 DIAGNOSIS — E7800 Pure hypercholesterolemia, unspecified: Secondary | ICD-10-CM | POA: Diagnosis not present

## 2018-09-27 DIAGNOSIS — R42 Dizziness and giddiness: Secondary | ICD-10-CM | POA: Diagnosis not present

## 2018-09-27 DIAGNOSIS — M545 Low back pain: Secondary | ICD-10-CM | POA: Diagnosis not present

## 2018-09-27 DIAGNOSIS — R030 Elevated blood-pressure reading, without diagnosis of hypertension: Secondary | ICD-10-CM | POA: Diagnosis not present

## 2018-10-03 DIAGNOSIS — K219 Gastro-esophageal reflux disease without esophagitis: Secondary | ICD-10-CM | POA: Diagnosis not present

## 2018-10-03 DIAGNOSIS — R42 Dizziness and giddiness: Secondary | ICD-10-CM | POA: Diagnosis not present

## 2018-10-03 DIAGNOSIS — I8390 Asymptomatic varicose veins of unspecified lower extremity: Secondary | ICD-10-CM | POA: Diagnosis not present

## 2018-10-03 DIAGNOSIS — E46 Unspecified protein-calorie malnutrition: Secondary | ICD-10-CM | POA: Diagnosis not present

## 2018-10-03 DIAGNOSIS — J309 Allergic rhinitis, unspecified: Secondary | ICD-10-CM | POA: Diagnosis not present

## 2018-10-03 DIAGNOSIS — G4700 Insomnia, unspecified: Secondary | ICD-10-CM | POA: Diagnosis not present

## 2018-10-03 DIAGNOSIS — R911 Solitary pulmonary nodule: Secondary | ICD-10-CM | POA: Diagnosis not present

## 2018-10-03 DIAGNOSIS — R030 Elevated blood-pressure reading, without diagnosis of hypertension: Secondary | ICD-10-CM | POA: Diagnosis not present

## 2018-10-03 DIAGNOSIS — M545 Low back pain: Secondary | ICD-10-CM | POA: Diagnosis not present

## 2018-11-26 DIAGNOSIS — J309 Allergic rhinitis, unspecified: Secondary | ICD-10-CM | POA: Diagnosis not present

## 2018-11-26 DIAGNOSIS — G4700 Insomnia, unspecified: Secondary | ICD-10-CM | POA: Diagnosis not present

## 2018-11-26 DIAGNOSIS — R030 Elevated blood-pressure reading, without diagnosis of hypertension: Secondary | ICD-10-CM | POA: Diagnosis not present

## 2018-11-26 DIAGNOSIS — E46 Unspecified protein-calorie malnutrition: Secondary | ICD-10-CM | POA: Diagnosis not present

## 2018-11-26 DIAGNOSIS — N390 Urinary tract infection, site not specified: Secondary | ICD-10-CM | POA: Diagnosis not present

## 2018-11-26 DIAGNOSIS — R911 Solitary pulmonary nodule: Secondary | ICD-10-CM | POA: Diagnosis not present

## 2018-11-26 DIAGNOSIS — M545 Low back pain: Secondary | ICD-10-CM | POA: Diagnosis not present

## 2018-11-26 DIAGNOSIS — I8390 Asymptomatic varicose veins of unspecified lower extremity: Secondary | ICD-10-CM | POA: Diagnosis not present

## 2018-11-26 DIAGNOSIS — R42 Dizziness and giddiness: Secondary | ICD-10-CM | POA: Diagnosis not present

## 2018-12-10 DIAGNOSIS — R911 Solitary pulmonary nodule: Secondary | ICD-10-CM | POA: Diagnosis not present

## 2018-12-10 DIAGNOSIS — M545 Low back pain: Secondary | ICD-10-CM | POA: Diagnosis not present

## 2018-12-10 DIAGNOSIS — J309 Allergic rhinitis, unspecified: Secondary | ICD-10-CM | POA: Diagnosis not present

## 2018-12-10 DIAGNOSIS — G4700 Insomnia, unspecified: Secondary | ICD-10-CM | POA: Diagnosis not present

## 2018-12-10 DIAGNOSIS — R42 Dizziness and giddiness: Secondary | ICD-10-CM | POA: Diagnosis not present

## 2018-12-10 DIAGNOSIS — K219 Gastro-esophageal reflux disease without esophagitis: Secondary | ICD-10-CM | POA: Diagnosis not present

## 2018-12-10 DIAGNOSIS — I8390 Asymptomatic varicose veins of unspecified lower extremity: Secondary | ICD-10-CM | POA: Diagnosis not present

## 2018-12-10 DIAGNOSIS — E46 Unspecified protein-calorie malnutrition: Secondary | ICD-10-CM | POA: Diagnosis not present

## 2018-12-10 DIAGNOSIS — R030 Elevated blood-pressure reading, without diagnosis of hypertension: Secondary | ICD-10-CM | POA: Diagnosis not present

## 2019-01-07 DIAGNOSIS — E7800 Pure hypercholesterolemia, unspecified: Secondary | ICD-10-CM | POA: Diagnosis not present

## 2019-01-07 DIAGNOSIS — G4700 Insomnia, unspecified: Secondary | ICD-10-CM | POA: Diagnosis not present

## 2019-01-07 DIAGNOSIS — I8390 Asymptomatic varicose veins of unspecified lower extremity: Secondary | ICD-10-CM | POA: Diagnosis not present

## 2019-01-07 DIAGNOSIS — R42 Dizziness and giddiness: Secondary | ICD-10-CM | POA: Diagnosis not present

## 2019-01-07 DIAGNOSIS — R911 Solitary pulmonary nodule: Secondary | ICD-10-CM | POA: Diagnosis not present

## 2019-01-07 DIAGNOSIS — J309 Allergic rhinitis, unspecified: Secondary | ICD-10-CM | POA: Diagnosis not present

## 2019-01-07 DIAGNOSIS — R030 Elevated blood-pressure reading, without diagnosis of hypertension: Secondary | ICD-10-CM | POA: Diagnosis not present

## 2019-01-07 DIAGNOSIS — M545 Low back pain: Secondary | ICD-10-CM | POA: Diagnosis not present

## 2019-01-07 DIAGNOSIS — Z23 Encounter for immunization: Secondary | ICD-10-CM | POA: Diagnosis not present

## 2019-01-07 DIAGNOSIS — E46 Unspecified protein-calorie malnutrition: Secondary | ICD-10-CM | POA: Diagnosis not present

## 2019-01-08 DIAGNOSIS — M4126 Other idiopathic scoliosis, lumbar region: Secondary | ICD-10-CM | POA: Diagnosis not present

## 2019-01-08 DIAGNOSIS — M9903 Segmental and somatic dysfunction of lumbar region: Secondary | ICD-10-CM | POA: Diagnosis not present

## 2019-01-08 DIAGNOSIS — M9902 Segmental and somatic dysfunction of thoracic region: Secondary | ICD-10-CM | POA: Diagnosis not present

## 2019-01-08 DIAGNOSIS — S336XXA Sprain of sacroiliac joint, initial encounter: Secondary | ICD-10-CM | POA: Diagnosis not present

## 2019-01-08 DIAGNOSIS — M9905 Segmental and somatic dysfunction of pelvic region: Secondary | ICD-10-CM | POA: Diagnosis not present

## 2019-01-08 DIAGNOSIS — S335XXA Sprain of ligaments of lumbar spine, initial encounter: Secondary | ICD-10-CM | POA: Diagnosis not present

## 2019-04-10 DIAGNOSIS — R911 Solitary pulmonary nodule: Secondary | ICD-10-CM | POA: Diagnosis not present

## 2019-04-10 DIAGNOSIS — E46 Unspecified protein-calorie malnutrition: Secondary | ICD-10-CM | POA: Diagnosis not present

## 2019-04-10 DIAGNOSIS — M545 Low back pain: Secondary | ICD-10-CM | POA: Diagnosis not present

## 2019-04-10 DIAGNOSIS — J309 Allergic rhinitis, unspecified: Secondary | ICD-10-CM | POA: Diagnosis not present

## 2019-04-10 DIAGNOSIS — K219 Gastro-esophageal reflux disease without esophagitis: Secondary | ICD-10-CM | POA: Diagnosis not present

## 2019-04-10 DIAGNOSIS — R42 Dizziness and giddiness: Secondary | ICD-10-CM | POA: Diagnosis not present

## 2019-04-10 DIAGNOSIS — E7800 Pure hypercholesterolemia, unspecified: Secondary | ICD-10-CM | POA: Diagnosis not present

## 2019-04-10 DIAGNOSIS — I8390 Asymptomatic varicose veins of unspecified lower extremity: Secondary | ICD-10-CM | POA: Diagnosis not present

## 2019-04-10 DIAGNOSIS — G4700 Insomnia, unspecified: Secondary | ICD-10-CM | POA: Diagnosis not present

## 2019-05-05 DIAGNOSIS — H2513 Age-related nuclear cataract, bilateral: Secondary | ICD-10-CM | POA: Diagnosis not present

## 2019-05-08 DIAGNOSIS — G4700 Insomnia, unspecified: Secondary | ICD-10-CM | POA: Diagnosis not present

## 2019-05-08 DIAGNOSIS — K219 Gastro-esophageal reflux disease without esophagitis: Secondary | ICD-10-CM | POA: Diagnosis not present

## 2019-05-08 DIAGNOSIS — E7800 Pure hypercholesterolemia, unspecified: Secondary | ICD-10-CM | POA: Diagnosis not present

## 2019-05-08 DIAGNOSIS — I8390 Asymptomatic varicose veins of unspecified lower extremity: Secondary | ICD-10-CM | POA: Diagnosis not present

## 2019-05-08 DIAGNOSIS — R42 Dizziness and giddiness: Secondary | ICD-10-CM | POA: Diagnosis not present

## 2019-05-08 DIAGNOSIS — J309 Allergic rhinitis, unspecified: Secondary | ICD-10-CM | POA: Diagnosis not present

## 2019-05-08 DIAGNOSIS — M545 Low back pain: Secondary | ICD-10-CM | POA: Diagnosis not present

## 2019-05-08 DIAGNOSIS — R911 Solitary pulmonary nodule: Secondary | ICD-10-CM | POA: Diagnosis not present

## 2019-05-08 DIAGNOSIS — E46 Unspecified protein-calorie malnutrition: Secondary | ICD-10-CM | POA: Diagnosis not present

## 2019-06-01 DIAGNOSIS — N3281 Overactive bladder: Secondary | ICD-10-CM | POA: Diagnosis not present

## 2019-06-01 DIAGNOSIS — K219 Gastro-esophageal reflux disease without esophagitis: Secondary | ICD-10-CM | POA: Diagnosis not present

## 2019-06-01 DIAGNOSIS — R42 Dizziness and giddiness: Secondary | ICD-10-CM | POA: Diagnosis not present

## 2019-06-01 DIAGNOSIS — J309 Allergic rhinitis, unspecified: Secondary | ICD-10-CM | POA: Diagnosis not present

## 2019-06-01 DIAGNOSIS — E46 Unspecified protein-calorie malnutrition: Secondary | ICD-10-CM | POA: Diagnosis not present

## 2019-06-01 DIAGNOSIS — E7800 Pure hypercholesterolemia, unspecified: Secondary | ICD-10-CM | POA: Diagnosis not present

## 2019-06-01 DIAGNOSIS — M545 Low back pain: Secondary | ICD-10-CM | POA: Diagnosis not present

## 2019-06-01 DIAGNOSIS — G4700 Insomnia, unspecified: Secondary | ICD-10-CM | POA: Diagnosis not present

## 2019-06-01 DIAGNOSIS — R911 Solitary pulmonary nodule: Secondary | ICD-10-CM | POA: Diagnosis not present

## 2019-06-08 DIAGNOSIS — E7800 Pure hypercholesterolemia, unspecified: Secondary | ICD-10-CM | POA: Diagnosis not present

## 2019-06-08 DIAGNOSIS — N3281 Overactive bladder: Secondary | ICD-10-CM | POA: Diagnosis not present

## 2019-06-08 DIAGNOSIS — K219 Gastro-esophageal reflux disease without esophagitis: Secondary | ICD-10-CM | POA: Diagnosis not present

## 2019-06-08 DIAGNOSIS — G4700 Insomnia, unspecified: Secondary | ICD-10-CM | POA: Diagnosis not present

## 2019-06-08 DIAGNOSIS — R42 Dizziness and giddiness: Secondary | ICD-10-CM | POA: Diagnosis not present

## 2019-06-08 DIAGNOSIS — M545 Low back pain: Secondary | ICD-10-CM | POA: Diagnosis not present

## 2019-06-08 DIAGNOSIS — R911 Solitary pulmonary nodule: Secondary | ICD-10-CM | POA: Diagnosis not present

## 2019-06-08 DIAGNOSIS — J309 Allergic rhinitis, unspecified: Secondary | ICD-10-CM | POA: Diagnosis not present

## 2019-06-08 DIAGNOSIS — E46 Unspecified protein-calorie malnutrition: Secondary | ICD-10-CM | POA: Diagnosis not present

## 2019-06-17 DIAGNOSIS — N952 Postmenopausal atrophic vaginitis: Secondary | ICD-10-CM | POA: Diagnosis not present

## 2019-06-17 DIAGNOSIS — N3941 Urge incontinence: Secondary | ICD-10-CM | POA: Diagnosis not present

## 2019-06-17 DIAGNOSIS — Z79899 Other long term (current) drug therapy: Secondary | ICD-10-CM | POA: Diagnosis not present

## 2019-06-30 DIAGNOSIS — Z0100 Encounter for examination of eyes and vision without abnormal findings: Secondary | ICD-10-CM | POA: Diagnosis not present

## 2019-06-30 DIAGNOSIS — Z961 Presence of intraocular lens: Secondary | ICD-10-CM | POA: Diagnosis not present

## 2019-07-07 DIAGNOSIS — J309 Allergic rhinitis, unspecified: Secondary | ICD-10-CM | POA: Diagnosis not present

## 2019-07-07 DIAGNOSIS — N3281 Overactive bladder: Secondary | ICD-10-CM | POA: Diagnosis not present

## 2019-07-07 DIAGNOSIS — R42 Dizziness and giddiness: Secondary | ICD-10-CM | POA: Diagnosis not present

## 2019-07-07 DIAGNOSIS — N390 Urinary tract infection, site not specified: Secondary | ICD-10-CM | POA: Diagnosis not present

## 2019-07-07 DIAGNOSIS — K219 Gastro-esophageal reflux disease without esophagitis: Secondary | ICD-10-CM | POA: Diagnosis not present

## 2019-07-07 DIAGNOSIS — G4700 Insomnia, unspecified: Secondary | ICD-10-CM | POA: Diagnosis not present

## 2019-07-07 DIAGNOSIS — M545 Low back pain: Secondary | ICD-10-CM | POA: Diagnosis not present

## 2019-07-07 DIAGNOSIS — E7800 Pure hypercholesterolemia, unspecified: Secondary | ICD-10-CM | POA: Diagnosis not present

## 2019-07-07 DIAGNOSIS — R911 Solitary pulmonary nodule: Secondary | ICD-10-CM | POA: Diagnosis not present

## 2019-08-10 DIAGNOSIS — S336XXA Sprain of sacroiliac joint, initial encounter: Secondary | ICD-10-CM | POA: Diagnosis not present

## 2019-08-10 DIAGNOSIS — M9903 Segmental and somatic dysfunction of lumbar region: Secondary | ICD-10-CM | POA: Diagnosis not present

## 2019-08-10 DIAGNOSIS — M9905 Segmental and somatic dysfunction of pelvic region: Secondary | ICD-10-CM | POA: Diagnosis not present

## 2019-08-10 DIAGNOSIS — S335XXA Sprain of ligaments of lumbar spine, initial encounter: Secondary | ICD-10-CM | POA: Diagnosis not present

## 2019-08-10 DIAGNOSIS — M4126 Other idiopathic scoliosis, lumbar region: Secondary | ICD-10-CM | POA: Diagnosis not present

## 2019-08-10 DIAGNOSIS — M9902 Segmental and somatic dysfunction of thoracic region: Secondary | ICD-10-CM | POA: Diagnosis not present

## 2019-08-11 DIAGNOSIS — N3281 Overactive bladder: Secondary | ICD-10-CM | POA: Diagnosis not present

## 2019-08-11 DIAGNOSIS — S336XXA Sprain of sacroiliac joint, initial encounter: Secondary | ICD-10-CM | POA: Diagnosis not present

## 2019-08-11 DIAGNOSIS — G4700 Insomnia, unspecified: Secondary | ICD-10-CM | POA: Diagnosis not present

## 2019-08-11 DIAGNOSIS — M9902 Segmental and somatic dysfunction of thoracic region: Secondary | ICD-10-CM | POA: Diagnosis not present

## 2019-08-11 DIAGNOSIS — R42 Dizziness and giddiness: Secondary | ICD-10-CM | POA: Diagnosis not present

## 2019-08-11 DIAGNOSIS — K219 Gastro-esophageal reflux disease without esophagitis: Secondary | ICD-10-CM | POA: Diagnosis not present

## 2019-08-11 DIAGNOSIS — S335XXA Sprain of ligaments of lumbar spine, initial encounter: Secondary | ICD-10-CM | POA: Diagnosis not present

## 2019-08-11 DIAGNOSIS — J309 Allergic rhinitis, unspecified: Secondary | ICD-10-CM | POA: Diagnosis not present

## 2019-08-11 DIAGNOSIS — Z0000 Encounter for general adult medical examination without abnormal findings: Secondary | ICD-10-CM | POA: Diagnosis not present

## 2019-08-11 DIAGNOSIS — R911 Solitary pulmonary nodule: Secondary | ICD-10-CM | POA: Diagnosis not present

## 2019-08-11 DIAGNOSIS — Z1331 Encounter for screening for depression: Secondary | ICD-10-CM | POA: Diagnosis not present

## 2019-08-11 DIAGNOSIS — Z9181 History of falling: Secondary | ICD-10-CM | POA: Diagnosis not present

## 2019-08-11 DIAGNOSIS — M9905 Segmental and somatic dysfunction of pelvic region: Secondary | ICD-10-CM | POA: Diagnosis not present

## 2019-08-11 DIAGNOSIS — E7800 Pure hypercholesterolemia, unspecified: Secondary | ICD-10-CM | POA: Diagnosis not present

## 2019-08-11 DIAGNOSIS — E785 Hyperlipidemia, unspecified: Secondary | ICD-10-CM | POA: Diagnosis not present

## 2019-08-11 DIAGNOSIS — E46 Unspecified protein-calorie malnutrition: Secondary | ICD-10-CM | POA: Diagnosis not present

## 2019-08-11 DIAGNOSIS — M4126 Other idiopathic scoliosis, lumbar region: Secondary | ICD-10-CM | POA: Diagnosis not present

## 2019-08-11 DIAGNOSIS — M545 Low back pain: Secondary | ICD-10-CM | POA: Diagnosis not present

## 2019-08-11 DIAGNOSIS — M9903 Segmental and somatic dysfunction of lumbar region: Secondary | ICD-10-CM | POA: Diagnosis not present

## 2019-08-17 DIAGNOSIS — R42 Dizziness and giddiness: Secondary | ICD-10-CM | POA: Diagnosis not present

## 2019-08-17 DIAGNOSIS — J309 Allergic rhinitis, unspecified: Secondary | ICD-10-CM | POA: Diagnosis not present

## 2019-08-17 DIAGNOSIS — G4700 Insomnia, unspecified: Secondary | ICD-10-CM | POA: Diagnosis not present

## 2019-08-17 DIAGNOSIS — E7800 Pure hypercholesterolemia, unspecified: Secondary | ICD-10-CM | POA: Diagnosis not present

## 2019-08-17 DIAGNOSIS — K219 Gastro-esophageal reflux disease without esophagitis: Secondary | ICD-10-CM | POA: Diagnosis not present

## 2019-08-17 DIAGNOSIS — N3281 Overactive bladder: Secondary | ICD-10-CM | POA: Diagnosis not present

## 2019-08-17 DIAGNOSIS — M545 Low back pain: Secondary | ICD-10-CM | POA: Diagnosis not present

## 2019-08-17 DIAGNOSIS — Z139 Encounter for screening, unspecified: Secondary | ICD-10-CM | POA: Diagnosis not present

## 2019-08-17 DIAGNOSIS — R911 Solitary pulmonary nodule: Secondary | ICD-10-CM | POA: Diagnosis not present

## 2019-09-24 DIAGNOSIS — M1711 Unilateral primary osteoarthritis, right knee: Secondary | ICD-10-CM | POA: Diagnosis not present

## 2019-12-31 DIAGNOSIS — H3343 Traction detachment of retina, bilateral: Secondary | ICD-10-CM | POA: Diagnosis not present

## 2020-01-05 DIAGNOSIS — K219 Gastro-esophageal reflux disease without esophagitis: Secondary | ICD-10-CM | POA: Diagnosis not present

## 2020-01-21 ENCOUNTER — Other Ambulatory Visit: Payer: Self-pay | Admitting: Oncology

## 2020-01-21 DIAGNOSIS — R61 Generalized hyperhidrosis: Secondary | ICD-10-CM | POA: Diagnosis not present

## 2020-01-21 DIAGNOSIS — R069 Unspecified abnormalities of breathing: Secondary | ICD-10-CM | POA: Diagnosis not present

## 2020-01-21 DIAGNOSIS — U071 COVID-19: Secondary | ICD-10-CM

## 2020-01-21 DIAGNOSIS — R109 Unspecified abdominal pain: Secondary | ICD-10-CM | POA: Diagnosis not present

## 2020-01-21 DIAGNOSIS — R52 Pain, unspecified: Secondary | ICD-10-CM | POA: Diagnosis not present

## 2020-01-21 DIAGNOSIS — R1084 Generalized abdominal pain: Secondary | ICD-10-CM | POA: Diagnosis not present

## 2020-01-21 DIAGNOSIS — Z209 Contact with and (suspected) exposure to unspecified communicable disease: Secondary | ICD-10-CM | POA: Diagnosis not present

## 2020-01-21 NOTE — Progress Notes (Signed)
I connected by phone with  Mrs. Veale to discuss the potential use of an new treatment for mild to moderate COVID-19 viral infection in non-hospitalized patients.   This patient is a age/sex that meets the FDA criteria for Emergency Use Authorization of casirivimab\imdevimab.  Has a (+) direct SARS-CoV-2 viral test result 1. Has mild or moderate COVID-19  2. Is ? 81 years of age and weighs ? 40 kg 3. Is NOT hospitalized due to COVID-19 4. Is NOT requiring oxygen therapy or requiring an increase in baseline oxygen flow rate due to COVID-19 5. Is within 10 days of symptom onset 6. Has at least one of the high risk factor(s) for progression to severe COVID-19 and/or hospitalization as defined in EUA. Specific high risk criteria : Past Medical History:  Diagnosis Date  . GERD (gastroesophageal reflux disease)   ?  ?    Symptom onset 01/20/20   I have spoken and communicated the following to the patient or parent/caregiver:   1. FDA has authorized the emergency use of casirivimab\imdevimab for the treatment of mild to moderate COVID-19 in adults and pediatric patients with positive results of direct SARS-CoV-2 viral testing who are 22 years of age and older weighing at least 40 kg, and who are at high risk for progressing to severe COVID-19 and/or hospitalization.   2. The significant known and potential risks and benefits of casirivimab\imdevimab, and the extent to which such potential risks and benefits are unknown.   3. Information on available alternative treatments and the risks and benefits of those alternatives, including clinical trials.   4. Patients treated with casirivimab\imdevimab should continue to self-isolate and use infection control measures (e.g., wear mask, isolate, social distance, avoid sharing personal items, clean and disinfect "high touch" surfaces, and frequent handwashing) according to CDC guidelines.    5. The patient or parent/caregiver has the option to accept or  refuse casirivimab\imdevimab .   After reviewing this information with the patient, The patient agreed to proceed with receiving casirivimab\imdevimab infusion and will be provided a copy of the Fact sheet prior to receiving the infusion.Mignon Pine, AGNP-C [phone] (Infusion Center Hotline)

## 2020-01-22 ENCOUNTER — Ambulatory Visit (HOSPITAL_COMMUNITY): Payer: Medicare HMO

## 2020-04-13 DIAGNOSIS — N3000 Acute cystitis without hematuria: Secondary | ICD-10-CM | POA: Diagnosis not present

## 2020-04-13 DIAGNOSIS — R300 Dysuria: Secondary | ICD-10-CM | POA: Diagnosis not present

## 2020-07-01 DIAGNOSIS — H3343 Traction detachment of retina, bilateral: Secondary | ICD-10-CM | POA: Diagnosis not present

## 2020-07-01 DIAGNOSIS — Z961 Presence of intraocular lens: Secondary | ICD-10-CM | POA: Diagnosis not present

## 2020-07-01 DIAGNOSIS — Z0100 Encounter for examination of eyes and vision without abnormal findings: Secondary | ICD-10-CM | POA: Diagnosis not present

## 2020-07-05 DIAGNOSIS — M1711 Unilateral primary osteoarthritis, right knee: Secondary | ICD-10-CM | POA: Diagnosis not present

## 2020-08-11 DIAGNOSIS — R300 Dysuria: Secondary | ICD-10-CM | POA: Diagnosis not present

## 2020-08-11 DIAGNOSIS — R531 Weakness: Secondary | ICD-10-CM | POA: Diagnosis not present

## 2020-08-11 DIAGNOSIS — N3000 Acute cystitis without hematuria: Secondary | ICD-10-CM | POA: Diagnosis not present

## 2020-08-11 DIAGNOSIS — Z13220 Encounter for screening for lipoid disorders: Secondary | ICD-10-CM | POA: Diagnosis not present

## 2020-08-11 DIAGNOSIS — Z8616 Personal history of COVID-19: Secondary | ICD-10-CM | POA: Diagnosis not present

## 2020-08-11 DIAGNOSIS — R42 Dizziness and giddiness: Secondary | ICD-10-CM | POA: Diagnosis not present

## 2020-08-11 DIAGNOSIS — R799 Abnormal finding of blood chemistry, unspecified: Secondary | ICD-10-CM | POA: Diagnosis not present

## 2020-09-12 DIAGNOSIS — W57XXXA Bitten or stung by nonvenomous insect and other nonvenomous arthropods, initial encounter: Secondary | ICD-10-CM | POA: Diagnosis not present

## 2020-09-12 DIAGNOSIS — R21 Rash and other nonspecific skin eruption: Secondary | ICD-10-CM | POA: Diagnosis not present

## 2020-10-25 DIAGNOSIS — K219 Gastro-esophageal reflux disease without esophagitis: Secondary | ICD-10-CM | POA: Diagnosis not present

## 2020-10-25 DIAGNOSIS — E782 Mixed hyperlipidemia: Secondary | ICD-10-CM | POA: Diagnosis not present

## 2021-01-03 DIAGNOSIS — H3343 Traction detachment of retina, bilateral: Secondary | ICD-10-CM | POA: Diagnosis not present

## 2021-02-17 DIAGNOSIS — M1711 Unilateral primary osteoarthritis, right knee: Secondary | ICD-10-CM | POA: Diagnosis not present

## 2021-02-17 DIAGNOSIS — H6122 Impacted cerumen, left ear: Secondary | ICD-10-CM | POA: Diagnosis not present
